# Patient Record
Sex: Female | Born: 1950 | Race: White | Hispanic: No | Marital: Single | Smoking: Never smoker
Health system: Southern US, Community
[De-identification: ages and names within clinical notes are randomized; demographics above are authoritative.]

## PROBLEM LIST (undated history)

## (undated) HISTORY — PX: KNEE SURGERY: SHX244

## (undated) HISTORY — PX: INCONTINENCE SURGERY: SHX676

## (undated) HISTORY — PX: CHOLECYSTECTOMY: SHX55

## (undated) HISTORY — PX: BACK SURGERY: SHX140

## (undated) HISTORY — PX: HAMMER TOE SURGERY: SHX385

## (undated) HISTORY — PX: APPENDECTOMY: SHX54

---

## 1998-04-27 ENCOUNTER — Ambulatory Visit (HOSPITAL_COMMUNITY): Admission: RE | Admit: 1998-04-27 | Discharge: 1998-04-27 | Payer: Self-pay | Admitting: Internal Medicine

## 1998-11-06 ENCOUNTER — Other Ambulatory Visit: Admission: RE | Admit: 1998-11-06 | Discharge: 1998-11-06 | Payer: Self-pay | Admitting: Obstetrics and Gynecology

## 1999-04-30 ENCOUNTER — Encounter: Payer: Self-pay | Admitting: Obstetrics and Gynecology

## 1999-04-30 ENCOUNTER — Ambulatory Visit (HOSPITAL_COMMUNITY): Admission: RE | Admit: 1999-04-30 | Discharge: 1999-04-30 | Payer: Self-pay | Admitting: Obstetrics and Gynecology

## 1999-09-03 ENCOUNTER — Encounter: Admission: RE | Admit: 1999-09-03 | Discharge: 1999-12-02 | Payer: Self-pay | Admitting: Orthopedic Surgery

## 1999-12-16 ENCOUNTER — Encounter: Admission: RE | Admit: 1999-12-16 | Discharge: 2000-03-15 | Payer: Self-pay | Admitting: Orthopedic Surgery

## 2000-01-20 ENCOUNTER — Encounter: Payer: Self-pay | Admitting: Neurosurgery

## 2000-01-21 ENCOUNTER — Observation Stay (HOSPITAL_COMMUNITY): Admission: RE | Admit: 2000-01-21 | Discharge: 2000-01-22 | Payer: Self-pay | Admitting: Neurosurgery

## 2000-01-21 ENCOUNTER — Encounter: Payer: Self-pay | Admitting: Neurosurgery

## 2000-03-25 ENCOUNTER — Emergency Department (HOSPITAL_COMMUNITY): Admission: EM | Admit: 2000-03-25 | Discharge: 2000-03-25 | Payer: Self-pay

## 2000-03-25 ENCOUNTER — Encounter: Payer: Self-pay | Admitting: Emergency Medicine

## 2000-03-30 ENCOUNTER — Ambulatory Visit (HOSPITAL_COMMUNITY): Admission: RE | Admit: 2000-03-30 | Discharge: 2000-03-30 | Payer: Self-pay | Admitting: Neurosurgery

## 2000-05-05 ENCOUNTER — Inpatient Hospital Stay (HOSPITAL_COMMUNITY): Admission: AD | Admit: 2000-05-05 | Discharge: 2000-05-11 | Payer: Self-pay | Admitting: Neurosurgery

## 2000-05-11 ENCOUNTER — Encounter: Payer: Self-pay | Admitting: Neurosurgery

## 2000-06-13 ENCOUNTER — Encounter: Payer: Self-pay | Admitting: Neurosurgery

## 2000-06-13 ENCOUNTER — Ambulatory Visit (HOSPITAL_COMMUNITY): Admission: RE | Admit: 2000-06-13 | Discharge: 2000-06-13 | Payer: Self-pay | Admitting: Neurosurgery

## 2000-07-03 ENCOUNTER — Inpatient Hospital Stay (HOSPITAL_COMMUNITY): Admission: RE | Admit: 2000-07-03 | Discharge: 2000-07-04 | Payer: Self-pay | Admitting: Neurosurgery

## 2000-07-03 ENCOUNTER — Encounter: Payer: Self-pay | Admitting: Neurosurgery

## 2000-07-16 ENCOUNTER — Encounter: Admission: RE | Admit: 2000-07-16 | Discharge: 2000-07-16 | Payer: Self-pay | Admitting: Infectious Diseases

## 2000-08-01 ENCOUNTER — Ambulatory Visit (HOSPITAL_COMMUNITY): Admission: RE | Admit: 2000-08-01 | Discharge: 2000-08-01 | Payer: Self-pay | Admitting: Neurosurgery

## 2000-08-01 ENCOUNTER — Encounter: Payer: Self-pay | Admitting: Neurosurgery

## 2000-08-06 ENCOUNTER — Encounter: Admission: RE | Admit: 2000-08-06 | Discharge: 2000-08-06 | Payer: Self-pay | Admitting: Infectious Diseases

## 2000-10-02 ENCOUNTER — Ambulatory Visit (HOSPITAL_COMMUNITY): Admission: RE | Admit: 2000-10-02 | Discharge: 2000-10-02 | Payer: Self-pay | Admitting: *Deleted

## 2000-10-02 ENCOUNTER — Encounter (INDEPENDENT_AMBULATORY_CARE_PROVIDER_SITE_OTHER): Payer: Self-pay | Admitting: Specialist

## 2000-10-05 ENCOUNTER — Ambulatory Visit (HOSPITAL_COMMUNITY): Admission: RE | Admit: 2000-10-05 | Discharge: 2000-10-05 | Payer: Self-pay | Admitting: *Deleted

## 2000-10-06 ENCOUNTER — Encounter: Admission: RE | Admit: 2000-10-06 | Discharge: 2000-10-22 | Payer: Self-pay | Admitting: Orthopedic Surgery

## 2000-10-14 ENCOUNTER — Encounter: Admission: RE | Admit: 2000-10-14 | Discharge: 2000-10-14 | Payer: Self-pay | Admitting: Infectious Diseases

## 2000-12-21 ENCOUNTER — Other Ambulatory Visit: Admission: RE | Admit: 2000-12-21 | Discharge: 2000-12-21 | Payer: Self-pay | Admitting: Obstetrics and Gynecology

## 2000-12-29 ENCOUNTER — Encounter: Admission: RE | Admit: 2000-12-29 | Discharge: 2001-03-29 | Payer: Self-pay | Admitting: Anesthesiology

## 2001-01-19 ENCOUNTER — Encounter: Admission: RE | Admit: 2001-01-19 | Discharge: 2001-03-15 | Payer: Self-pay | Admitting: Orthopedic Surgery

## 2001-02-02 ENCOUNTER — Ambulatory Visit (HOSPITAL_COMMUNITY): Admission: RE | Admit: 2001-02-02 | Discharge: 2001-02-02 | Payer: Self-pay | Admitting: Neurosurgery

## 2001-02-02 ENCOUNTER — Encounter: Payer: Self-pay | Admitting: Neurosurgery

## 2001-05-11 ENCOUNTER — Encounter: Admission: RE | Admit: 2001-05-11 | Discharge: 2001-05-31 | Payer: Self-pay | Admitting: Internal Medicine

## 2001-09-07 ENCOUNTER — Encounter: Admission: RE | Admit: 2001-09-07 | Discharge: 2001-09-27 | Payer: Self-pay | Admitting: Family Medicine

## 2002-01-09 ENCOUNTER — Encounter: Admission: RE | Admit: 2002-01-09 | Discharge: 2002-02-25 | Payer: Self-pay | Admitting: Internal Medicine

## 2002-01-12 ENCOUNTER — Encounter (HOSPITAL_BASED_OUTPATIENT_CLINIC_OR_DEPARTMENT_OTHER): Payer: Self-pay | Admitting: Internal Medicine

## 2002-05-02 ENCOUNTER — Encounter (HOSPITAL_BASED_OUTPATIENT_CLINIC_OR_DEPARTMENT_OTHER): Admission: RE | Admit: 2002-05-02 | Discharge: 2002-05-06 | Payer: Self-pay | Admitting: Orthopedic Surgery

## 2002-05-11 ENCOUNTER — Other Ambulatory Visit: Admission: RE | Admit: 2002-05-11 | Discharge: 2002-05-11 | Payer: Self-pay | Admitting: Obstetrics and Gynecology

## 2002-06-14 ENCOUNTER — Encounter (HOSPITAL_BASED_OUTPATIENT_CLINIC_OR_DEPARTMENT_OTHER): Admission: RE | Admit: 2002-06-14 | Discharge: 2002-09-12 | Payer: Self-pay | Admitting: Orthopedic Surgery

## 2002-11-28 ENCOUNTER — Encounter (HOSPITAL_BASED_OUTPATIENT_CLINIC_OR_DEPARTMENT_OTHER): Admission: RE | Admit: 2002-11-28 | Discharge: 2003-02-26 | Payer: Self-pay | Admitting: Internal Medicine

## 2003-02-24 ENCOUNTER — Ambulatory Visit (HOSPITAL_COMMUNITY): Admission: RE | Admit: 2003-02-24 | Discharge: 2003-02-24 | Payer: Self-pay | Admitting: *Deleted

## 2003-05-31 ENCOUNTER — Inpatient Hospital Stay (HOSPITAL_COMMUNITY): Admission: RE | Admit: 2003-05-31 | Discharge: 2003-06-02 | Payer: Self-pay | Admitting: Urology

## 2003-05-31 ENCOUNTER — Encounter: Payer: Self-pay | Admitting: Urology

## 2003-06-01 ENCOUNTER — Encounter (INDEPENDENT_AMBULATORY_CARE_PROVIDER_SITE_OTHER): Payer: Self-pay | Admitting: Specialist

## 2004-03-14 ENCOUNTER — Other Ambulatory Visit: Admission: RE | Admit: 2004-03-14 | Discharge: 2004-03-14 | Payer: Self-pay | Admitting: Obstetrics and Gynecology

## 2004-04-05 ENCOUNTER — Encounter (HOSPITAL_BASED_OUTPATIENT_CLINIC_OR_DEPARTMENT_OTHER): Admission: RE | Admit: 2004-04-05 | Discharge: 2004-04-12 | Payer: Self-pay | Admitting: Internal Medicine

## 2004-07-10 ENCOUNTER — Encounter (HOSPITAL_BASED_OUTPATIENT_CLINIC_OR_DEPARTMENT_OTHER): Admission: RE | Admit: 2004-07-10 | Discharge: 2004-08-28 | Payer: Self-pay | Admitting: Internal Medicine

## 2004-09-13 ENCOUNTER — Encounter: Admission: RE | Admit: 2004-09-13 | Discharge: 2004-09-13 | Payer: Self-pay | Admitting: Obstetrics and Gynecology

## 2004-11-24 LAB — HM COLONOSCOPY: HM Colonoscopy: NORMAL

## 2004-11-26 ENCOUNTER — Ambulatory Visit (HOSPITAL_COMMUNITY): Admission: RE | Admit: 2004-11-26 | Discharge: 2004-11-26 | Payer: Self-pay | Admitting: Cardiology

## 2005-09-04 ENCOUNTER — Encounter: Admission: RE | Admit: 2005-09-04 | Discharge: 2005-09-04 | Payer: Self-pay | Admitting: Endocrinology

## 2006-06-03 ENCOUNTER — Encounter: Admission: RE | Admit: 2006-06-03 | Discharge: 2006-06-03 | Payer: Self-pay | Admitting: Obstetrics and Gynecology

## 2008-02-28 ENCOUNTER — Inpatient Hospital Stay (HOSPITAL_COMMUNITY): Admission: EM | Admit: 2008-02-28 | Discharge: 2008-03-01 | Payer: Self-pay | Admitting: Emergency Medicine

## 2008-09-13 ENCOUNTER — Encounter: Admission: RE | Admit: 2008-09-13 | Discharge: 2008-09-13 | Payer: Self-pay | Admitting: Endocrinology

## 2009-05-17 ENCOUNTER — Encounter: Admission: RE | Admit: 2009-05-17 | Discharge: 2009-05-17 | Payer: Self-pay | Admitting: Anesthesiology

## 2010-09-28 LAB — HM DIABETES FOOT EXAM

## 2010-10-08 ENCOUNTER — Encounter: Payer: Self-pay | Admitting: Internal Medicine

## 2010-10-08 ENCOUNTER — Ambulatory Visit: Payer: Self-pay | Admitting: Family Medicine

## 2010-10-08 DIAGNOSIS — J45909 Unspecified asthma, uncomplicated: Secondary | ICD-10-CM | POA: Insufficient documentation

## 2010-10-08 DIAGNOSIS — C189 Malignant neoplasm of colon, unspecified: Secondary | ICD-10-CM | POA: Insufficient documentation

## 2010-10-08 DIAGNOSIS — F329 Major depressive disorder, single episode, unspecified: Secondary | ICD-10-CM

## 2010-10-08 DIAGNOSIS — L97409 Non-pressure chronic ulcer of unspecified heel and midfoot with unspecified severity: Secondary | ICD-10-CM | POA: Insufficient documentation

## 2010-10-08 DIAGNOSIS — N3941 Urge incontinence: Secondary | ICD-10-CM | POA: Insufficient documentation

## 2010-10-08 DIAGNOSIS — R300 Dysuria: Secondary | ICD-10-CM | POA: Insufficient documentation

## 2010-10-08 DIAGNOSIS — I10 Essential (primary) hypertension: Secondary | ICD-10-CM

## 2010-10-08 DIAGNOSIS — E1149 Type 2 diabetes mellitus with other diabetic neurological complication: Secondary | ICD-10-CM | POA: Insufficient documentation

## 2010-10-08 DIAGNOSIS — D509 Iron deficiency anemia, unspecified: Secondary | ICD-10-CM

## 2010-10-08 DIAGNOSIS — G4700 Insomnia, unspecified: Secondary | ICD-10-CM

## 2010-10-08 DIAGNOSIS — E1142 Type 2 diabetes mellitus with diabetic polyneuropathy: Secondary | ICD-10-CM

## 2010-10-08 DIAGNOSIS — E7800 Pure hypercholesterolemia, unspecified: Secondary | ICD-10-CM

## 2010-10-08 DIAGNOSIS — M545 Low back pain: Secondary | ICD-10-CM

## 2010-10-08 DIAGNOSIS — A4902 Methicillin resistant Staphylococcus aureus infection, unspecified site: Secondary | ICD-10-CM | POA: Insufficient documentation

## 2010-10-08 DIAGNOSIS — L97509 Non-pressure chronic ulcer of other part of unspecified foot with unspecified severity: Secondary | ICD-10-CM

## 2010-10-08 DIAGNOSIS — K219 Gastro-esophageal reflux disease without esophagitis: Secondary | ICD-10-CM

## 2010-10-08 DIAGNOSIS — J309 Allergic rhinitis, unspecified: Secondary | ICD-10-CM | POA: Insufficient documentation

## 2010-10-08 LAB — CONVERTED CEMR LAB
Bilirubin Urine: NEGATIVE
Glucose, Urine, Semiquant: NEGATIVE
Protein, U semiquant: 30
Specific Gravity, Urine: 1.015
pH: 5

## 2010-10-15 ENCOUNTER — Ambulatory Visit: Payer: Self-pay | Admitting: Internal Medicine

## 2010-10-15 ENCOUNTER — Telehealth: Payer: Self-pay | Admitting: Family Medicine

## 2010-10-16 ENCOUNTER — Encounter: Payer: Self-pay | Admitting: Family Medicine

## 2010-10-22 ENCOUNTER — Telehealth: Payer: Self-pay | Admitting: Family Medicine

## 2010-10-23 ENCOUNTER — Ambulatory Visit: Payer: Self-pay

## 2010-10-30 ENCOUNTER — Ambulatory Visit: Payer: Self-pay

## 2010-11-04 ENCOUNTER — Ambulatory Visit: Payer: Self-pay | Admitting: Internal Medicine

## 2010-11-14 ENCOUNTER — Ambulatory Visit: Payer: Self-pay

## 2010-11-14 ENCOUNTER — Other Ambulatory Visit: Payer: Self-pay

## 2010-11-20 ENCOUNTER — Ambulatory Visit: Payer: Self-pay

## 2010-11-22 ENCOUNTER — Ambulatory Visit: Payer: Self-pay

## 2010-11-22 ENCOUNTER — Ambulatory Visit: Payer: Self-pay | Admitting: Family Medicine

## 2010-11-29 ENCOUNTER — Encounter (INDEPENDENT_AMBULATORY_CARE_PROVIDER_SITE_OTHER): Payer: Self-pay | Admitting: *Deleted

## 2010-11-29 ENCOUNTER — Ambulatory Visit: Admit: 2010-11-29 | Payer: Self-pay | Admitting: Internal Medicine

## 2010-11-29 ENCOUNTER — Emergency Department: Payer: Self-pay | Admitting: Emergency Medicine

## 2010-12-15 ENCOUNTER — Encounter: Payer: Self-pay | Admitting: Obstetrics and Gynecology

## 2010-12-15 ENCOUNTER — Encounter: Payer: Self-pay | Admitting: Endocrinology

## 2010-12-24 NOTE — Assessment & Plan Note (Signed)
Summary: NEW MEDICARE TO ESTABH/DLO   Vital Signs:  Patient profile:   60 year old female Height:      65 inches Weight:      244.0 pounds BMI:     40.75 Temp:     98.6 degrees F oral Pulse rate:   72 / minute Pulse rhythm:   regular BP sitting:   120 / 72  (left arm) Cuff size:   regular  Vitals Entered By: Benny Lennert CMA Duncan Dull) (October 08, 2010 10:10 AM)  History of Present Illness: Chief complaint New Medicare Patient   Here to establish...has been seen by Dr. Juleen China in [place] for primary MD. Too far to drive.  Has been off all oral med in last month..since did not go back to previous MD.  Dx with colon adenocarcinoma in 1995...has not seen ONC in 7 years.  S/P radiation, chem and colon resection.   Chronic back pain, B carpal tunnel, peripheral neuropathy... Seen by Dr. Thyra Breed at pain Center... On GApapentin and OPANA..Well  controlled on these meds  Depression, wel controlled on sertraline daily.   Diabetes..Dx 15 years ago: On Actos, JAnuvia, LAntus 80 Units daily, Humalog SSI...doesn't have to use a lot.  LAst A1C 9.something..but thinks this was last year. FBS..100-109, 2 hours postprandial...not checking then, no low LAst saw primary 2-3 months ago..she never went in for labs.  Current lesion in left toe, ulcers on right leg..off and on not healing..present x 10 months..saw wound care center but could not keep up with appts/ cost. no pus..  High cholesterol: on crestor..? control  GERD, well controlled when on aciphex..not well controlled when on pantoprazole x several months  HTN, well controlled.[place] Footman is only  med she has been taking.    Problems Prior to Update: 1)  Dysuria  (ICD-788.1) 2)  Diabetic Foot Ulcer, Toe  (ICD-707.15) 3)  Diabetic Foot Ulcer, Right  (ICD-707.14) 4)  Hx of Methicillin Resistant Staphylococcus Aureus Infection  (ICD-041.12) 5)  Obesity, Morbid  (ICD-278.01) 6)  Insomnia  (ICD-780.52) 7)  Incontinence, Urge   (ICD-788.31) 8)  Nephrolithiasis, Hx of  (ICD-V13.01) 9)  Hypertension, Benign Essential  (ICD-401.1) 10)  Gerd  (ICD-530.81) 11)  Depression, Major  (ICD-296.20) 12)  Allergic Rhinitis  (ICD-477.9) 13)  Asthma, Persistent, Moderate  (ICD-493.90) 14)  Diabetic Peripheral Neuropathy  (ICD-250.60) 15)  Back Pain, Lumbar, Chronic  (ICD-724.2) 16)  Adenocarcinoma, Colon  (ICD-153.9) 17)  Anemia, Iron Deficiency  (ICD-280.9)  Current Medications (verified): 1)  Zoloft 100 Mg Tabs (Sertraline Hcl) .... Two Tabs Daily 2)  Nasonex 50 Mcg/act Susp (Mometasone Furoate) .... 2 Sprays in Each Nostril Daily 3)  Singulair 10 Mg Tabs (Montelukast Sodium) .Marland Kitchen.. 1 Tablet Daily 4)  Klor-Con 10 10 Meq Cr-Tabs (Potassium Chloride) .Marland Kitchen.. 1-2 Tabs Daily 5)  Benicar Hct 40-25 Mg Tabs (Olmesartan Medoxomil-Hctz) .Marland Kitchen.. 1 Tablet Daily 6)  Bystolic 5 Mg Tabs (Nebivolol Hcl) .... Once Daily 7)  Enablex 15 Mg Xr24h-Tab (Darifenacin Hydrobromide) .... Once Daily 8)  Lunesta 3 Mg Tabs (Eszopiclone) .... Once Daily At Bedtime 9)  Torsemide 20 Mg Tabs (Torsemide) .... Take One Tablet Daily 10)  Crestor 20 Mg Tabs (Rosuvastatin Calcium) .... One Tablet Daily 11)  Neurontin 800 Mg Tabs (Gabapentin) .... One Tablet Every 8 Hours 12)  Opana Er 20 Mg Xr12h-Tab (Oxymorphone Hcl) .... Take One Tablet Every 12 Hours 13)  Aciphex 20 Mg Tbec (Rabeprazole Sodium) .... One Tablet Dialy 14)  Actos 45 Mg Tabs (Pioglitazone Hcl) .Marland KitchenMarland KitchenMarland Kitchen  One Tablet Daily 15)  Januvia 100 Mg Tabs (Sitagliptin Phosphate) .... Take One Tablet Daily 16)  Promethazine Hcl 25 Mg/ml Soln (Promethazine Hcl) .... As Needed 17)  Lantus Solostar 100 Unit/ml Soln (Insulin Glargine) .... 80 Units Two Times A Day 18)  Humalog 100 Unit/ml Soln (Insulin Lispro (Human)) .... Use A Sliding Scale 19)  Proair Hfa 108 (90 Base) Mcg/act Aers (Albuterol Sulfate) .... 2 Puffs By Mouth Every 4 Hours Prn 20)  Symbicort 80-4.5 Mcg/act Aero (Budesonide-Formoterol Fumarate) .... 2  Puffs Two Times A Day 21)  Onetouch Test  Strp (Glucose Blood) .... Twice Daily 22)  Gnp Ultra Com Insulin Syringe 31g X 5/16" 1 Ml Misc (Insulin Syringe-Needle U-100) .Marland Kitchen.. 100 Count 23)  Sure Comfort Pen Needles 31g X 8 Mm Misc (Insulin Pen Needle) .Marland Kitchen.. 100 Count 24)  Aspir-Low 81 Mg Tbec (Aspirin) .... One Tablet Dialy 25)  Ferrous Sulfate 325 (65 Fe) Mg Tabs (Ferrous Sulfate) .... One Tablet Daily 26)  Calcium 500 Mg Tabs (Calcium) .... One Tablet Daily  Allergies (verified): 1)  ! Sulfa 2)  ! Codeine 3)  ! Doxycycline  Past History:  Past medical, surgical, family and social histories (including risk factors) reviewed, and no changes noted (except as noted below).  Past Surgical History: partial colectomy for CA...1995  appendectomy at same time 1995 Bladder netting for urge incontinence  2006 Hammer toe repair: 1994 lumbar back surgery for herniated disc 2000..complicated with MRSA wound nfection right knee surgery...2009   Family History: Reviewed history and no changes required. father:CVA  mother: died age 48 brother: CAD age 75s..due to infeciton in heart per pt  Social History: Reviewed history and no changes required. Disabled due to back issues Walks with cane.  Review of Systems       Rash, ulcer on right leg, itching.Marland Kitchen weeping.Marland KitchenMarland Kitchenpresent x 6 months. General:  Denies fatigue and fever. CV:  Denies chest pain or discomfort. Resp:  Denies shortness of breath. GI:  Denies abdominal pain, bloody stools, constipation, and diarrhea. GU:  Denies hematuria and nocturia; pressure in bladder when urinating.  Physical Exam  General:  Obese unkempt appearing female inNNAD  Ears:  External ear exam shows no significant lesions or deformities.  Otoscopic examination reveals clear canals, tympanic membranes are intact bilaterally without bulging, retraction, inflammation or discharge. Hearing is grossly normal bilaterally. Nose:  External nasal examination shows no  deformity or inflammation. Nasal mucosa are pink and moist without lesions or exudates. Mouth:  MMM, poor dentition.  Neck:  no carotid bruit or thyromegaly no cervical or supraclavicular lymphadenopathy  Lungs:  Normal respiratory effort, chest expands symmetrically. Lungs are clear to auscultation, no crackles or wheezes. Heart:  Normal rate and regular rhythm. S1 and S2 normal without gallop, murmur, click, rub or other extra sounds. Abdomen:  Bowel sounds positive,abdomen soft and non-tender without masses, organomegaly or hernias noted. Msk:  No deformity or scoliosis noted of thoracic or lumbar spine.   Pulses:  R and L posterior tibial pulses are full and equal bilaterally  Extremities:  left toe with bandage..removed..granulation tissue in large wound on great toe, appears adequate perfusion. Right calf..6 ich by 4 inch ulceration mid calf..clear serosanguinous discharge, layer of nectrotic, moist tissue, few areas of granulation tissue.  Diabetes Management Exam:    Foot Exam (with socks and/or shoes not present):       Sensory-Pinprick/Light touch:          Left medial foot (L-4): diminished  Left dorsal foot (L-5): diminished          Left lateral foot (S-1): diminished          Right medial foot (L-4): diminished          Right dorsal foot (L-5): diminished          Right lateral foot (S-1): diminished       Sensory-Monofilament:          Left foot: diminished          Right foot: diminished       Inspection:          Left foot: abnormal          Right foot: abnormal       Nails:          Left foot: thickened          Right foot: thickened   Impression & Recommendations:  Problem # 1:  DIABETIC FOOT ULCER, RIGHT (ICD-707.14) On right leg Chronic...no clear ongoing infection. Likely needs UNNA boot as wel as debridement to heal properly. Refer to wound center ASAP.  can follow here for unna boot changes if needed once eval and treament plan set.   Orders: Gastroenterology Referral (GI)  Problem # 2:  DIABETIC FOOT ULCER, TOE (ICD-707.15) Assessment: Comment Only  Orders: Wound Care Center Referral (Wound Care)  Problem # 3:  DYSURIA (ICD-788.1) No clear infection, but contaminated...send for clture.  Push fluids.  Her updated medication list for this problem includes:    Enablex 15 Mg Xr24h-tab (Darifenacin hydrobromide) ..... Once daily  Orders: UA Dipstick W/ Micro (manual) (36644) T-Culture, Urine (03474-25956) Specimen Handling (99000)  Problem # 4:  HYPERTENSION, BENIGN ESSENTIAL (ICD-401.1) Assessment: Unchanged Restart medicaiton. Follow BPs at home.  Her updated medication list for this problem includes:    Benicar Hct 40-25 Mg Tabs (Olmesartan medoxomil-hctz) .Marland Kitchen... 1 tablet daily    Bystolic 5 Mg Tabs (Nebivolol hcl) ..... Once daily    Torsemide 20 Mg Tabs (Torsemide) .Marland Kitchen... Take one tablet daily  BP today: 120/72  Problem # 5:  DEPRESSION, MAJOR (ICD-296.20) Stable on current dose of sertraline.   Problem # 6:  BACK PAIN, LUMBAR, CHRONIC (ICD-724.2) Seen by Dr. Thyra Breed at pain Center... On GApapentin and OPANA Her updated medication list for this problem includes:    Opana Er 20 Mg Xr12h-tab (Oxymorphone hcl) .Marland Kitchen... Take one tablet every 12 hours    Aspir-low 81 Mg Tbec (Aspirin) ..... One tablet dialy  Problem # 7:  DIABETES MELLITUS, TYPE II (ICD-250.00) Restart meds..check A1C in 3 months..per pt previously well contorlled on these meds.  Her updated medication list for this problem includes:    Benicar Hct 40-25 Mg Tabs (Olmesartan medoxomil-hctz) .Marland Kitchen... 1 tablet daily    Actos 45 Mg Tabs (Pioglitazone hcl) ..... One tablet daily    Januvia 100 Mg Tabs (Sitagliptin phosphate) .Marland Kitchen... Take one tablet daily    Lantus Solostar 100 Unit/ml Soln (Insulin glargine) .Marland KitchenMarland KitchenMarland KitchenMarland Kitchen 80 units two times a day    Humalog 100 Unit/ml Soln (Insulin lispro (human)) ..... Use a sliding scale    Aspir-low 81 Mg Tbec  (Aspirin) ..... One tablet dialy  Problem # 8:  HYPERCHOLESTEROLEMIA (ICD-272.0) Restart crestor..check lipids in 3 months.  Her updated medication list for this problem includes:    Crestor 20 Mg Tabs (Rosuvastatin calcium) ..... One tablet daily  Problem # 9:  ADENOCARCINOMA, COLON (ICD-153.9) Overdue for recheck colonoscopy. Refer to GI ASAP.  Orders: Gastroenterology Referral (GI)  Complete Medication List: 1)  Zoloft 100 Mg Tabs (Sertraline hcl) .... Two tabs daily 2)  Nasonex 50 Mcg/act Susp (Mometasone furoate) .... 2 sprays in each nostril daily 3)  Singulair 10 Mg Tabs (Montelukast sodium) .Marland Kitchen.. 1 tablet daily 4)  Klor-con 10 10 Meq Cr-tabs (Potassium chloride) .Marland Kitchen.. 1-2 tabs daily 5)  Benicar Hct 40-25 Mg Tabs (Olmesartan medoxomil-hctz) .Marland Kitchen.. 1 tablet daily 6)  Bystolic 5 Mg Tabs (Nebivolol hcl) .... Once daily 7)  Enablex 15 Mg Xr24h-tab (Darifenacin hydrobromide) .... Once daily 8)  Lunesta 3 Mg Tabs (Eszopiclone) .... Once daily at bedtime 9)  Torsemide 20 Mg Tabs (Torsemide) .... Take one tablet daily 10)  Crestor 20 Mg Tabs (Rosuvastatin calcium) .... One tablet daily 11)  Neurontin 800 Mg Tabs (Gabapentin) .... One tablet every 8 hours 12)  Opana Er 20 Mg Xr12h-tab (Oxymorphone hcl) .... Take one tablet every 12 hours 13)  Aciphex 20 Mg Tbec (Rabeprazole sodium) .... One tablet dialy 14)  Actos 45 Mg Tabs (Pioglitazone hcl) .... One tablet daily 15)  Januvia 100 Mg Tabs (Sitagliptin phosphate) .... Take one tablet daily 16)  Promethazine Hcl 25 Mg/ml Soln (Promethazine hcl) .... As needed 17)  Lantus Solostar 100 Unit/ml Soln (Insulin glargine) .... 80 units two times a day 18)  Humalog 100 Unit/ml Soln (Insulin lispro (human)) .... Use a sliding scale 19)  Proair Hfa 108 (90 Base) Mcg/act Aers (Albuterol sulfate) .... 2 puffs by mouth every 4 hours prn 20)  Symbicort 80-4.5 Mcg/act Aero (Budesonide-formoterol fumarate) .... 2 puffs two times a day 21)  Onetouch Test  Strp (Glucose blood) .... Twice daily 22)  Gnp Ultra Com Insulin Syringe 31g X 5/16" 1 Ml Misc (Insulin syringe-needle u-100) .Marland Kitchen.. 100 count 23)  Sure Comfort Pen Needles 31g X 8 Mm Misc (Insulin pen needle) .Marland Kitchen.. 100 count 24)  Aspir-low 81 Mg Tbec (Aspirin) .... One tablet dialy 25)  Ferrous Sulfate 325 (65 Fe) Mg Tabs (Ferrous sulfate) .... One tablet daily 26)  Calcium 500 Mg Tabs (Calcium) .... One tablet daily  Other Orders: Flu Vaccine 87yrs + MEDICARE PATIENTS (Z6109) Administration Flu vaccine - MCR (U0454)  Patient Instructions: 1)  Referral Appointment Information 2)  Day/Date: 3)  Time: 4)  Place/MD: 5)  Address: 6)  Phone/Fax: 7)  Patient given appointment information. Information/Orders faxed/mailed.  8)  ****Restart all meds*** 9)  Check fasting blood sugar daily and 2 hours after meals...write it down and bring record on measurements. 10)  Make appt for CPX in next month.  11)   In 3 months... DM follow up 12)  Fasting lipids, A1C, CMET Dx 250.00 prior to appt. Prescriptions: HUMALOG 100 UNIT/ML SOLN (INSULIN LISPRO (HUMAN)) USE A SLIDING SCALE  #0 x 0   Entered and Authorized by:   Kerby Nora MD   Signed by:   Kerby Nora MD on 10/08/2010   Method used:   Print then Give to Patient   RxID:   [id number] LANTUS SOLOSTAR 100 UNIT/ML SOLN (INSULIN GLARGINE) 80 UNITS two times a day  #6 boxes x 0   Entered and Authorized by:   Kerby Nora MD   Signed by:   Kerby Nora MD on 10/08/2010   Method used:   Print then Give to Patient   RxID:   [id number] JANUVIA 100 MG TABS (SITAGLIPTIN PHOSPHATE) TAKE ONE TABLET DAILY  #90 x 0   Entered and Authorized by:   Kerby Nora MD   Signed  by:   Kerby Nora MD on 10/08/2010   Method used:   Print then Give to Patient   RxID:   [id number] ACTOS 45 MG TABS (PIOGLITAZONE HCL) ONE TABLET DAILY  #90 x 0   Entered and Authorized by:   Kerby Nora MD   Signed by:   Kerby Nora MD on 10/08/2010   Method used:   Print  then Give to Patient   RxID:   [id number] ACIPHEX 20 MG TBEC (RABEPRAZOLE SODIUM) ONE TABLET DIALY  #90 x 0   Entered and Authorized by:   Kerby Nora MD   Signed by:   Kerby Nora MD on 10/08/2010   Method used:   Print then Give to Patient   RxID:   [id number] CRESTOR 20 MG TABS (ROSUVASTATIN CALCIUM) ONE TABLET DAILY  #90 x 0   Entered and Authorized by:   Kerby Nora MD   Signed by:   Kerby Nora MD on 10/08/2010   Method used:   Print then Give to Patient   RxID:   [id number] TORSEMIDE 20 MG TABS (TORSEMIDE) TAKE ONE TABLET DAILY  #90 x 0   Entered and Authorized by:   Kerby Nora MD   Signed by:   Kerby Nora MD on 10/08/2010   Method used:   Print then Give to Patient   RxID:   [id number] LUNESTA 3 MG TABS (ESZOPICLONE) once daily AT BEDTIME  #90 x 0   Entered and Authorized by:   Kerby Nora MD   Signed by:   Kerby Nora MD on 10/08/2010   Method used:   Print then Give to Patient   RxID:   [id number] ENABLEX 15 MG XR24H-TAB (DARIFENACIN HYDROBROMIDE) once daily  #90 x 0   Entered and Authorized by:   Kerby Nora MD   Signed by:   Kerby Nora MD on 10/08/2010   Method used:   Print then Give to Patient   RxID:   [id number] BYSTOLIC 5 MG TABS (NEBIVOLOL HCL) once daily  #90 x 0   Entered and Authorized by:   Kerby Nora MD   Signed by:   Kerby Nora MD on 10/08/2010   Method used:   Print then Give to Patient   RxID:   [id number] BENICAR HCT 40-25 MG TABS (OLMESARTAN MEDOXOMIL-HCTZ) 1 TABLET DAILY  #90 x 0   Entered and Authorized by:   Kerby Nora MD   Signed by:   Kerby Nora MD on 10/08/2010   Method used:   Print then Give to Patient   RxID:   [id number] KLOR-CON 10 10 MEQ CR-TABS (POTASSIUM CHLORIDE) 1-2 TABS DAILY  #90 x 0   Entered and Authorized by:   Kerby Nora MD   Signed by:   Kerby Nora MD on 10/08/2010   Method used:   Print then Give to Patient   RxID:   [id number] SINGULAIR 10 MG TABS (MONTELUKAST  SODIUM) 1 tablet daily  #90 x 0   Entered and Authorized by:   Kerby Nora MD   Signed by:   Kerby Nora MD on 10/08/2010   Method used:   Print then Give to Patient   RxID:   [id number] NASONEX 50 MCG/ACT SUSP (MOMETASONE FUROATE) 2 sprays in each nostril daily  #3 x 0   Entered and Authorized by:   Kerby Nora MD   Signed by:   Kerby Nora MD on 10/08/2010   Method used:   Print then Give to Patient   RxID:  [id number] ZOLOFT 100 MG TABS (SERTRALINE HCL) two tabs daily  #180 x 0   Entered and Authorized by:   Kerby Nora MD   Signed by:   Kerby Nora MD on 10/08/2010   Method used:   Print then Give to Patient   RxID:   [id number]    Orders Added: 1)  Flu Vaccine 8yrs + MEDICARE PATIENTS [Q2039] 2)  Administration Flu vaccine - MCR [G0008] 3)  UA Dipstick W/ Micro (manual) [81000] 4)  T-Culture, Urine [[id number]] 5)  Gastroenterology Referral [GI] 6)  Wound Care Center Referral [Wound Care] 7)  Specimen Handling [99000] 8)  New Patient Level III [63875]    Current Allergies (reviewed today): ! SULFA ! CODEINE ! DOXYCYCLINE                   Flu Vaccine Consent Questions     Do you have a history of severe allergic reactions to this vaccine? no    Any prior history of allergic reactions to egg and/or gelatin? no    Do you have a sensitivity to the preservative Thimersol? no    Do you have a past history of Guillan-Barre Syndrome? no    Do you currently have an acute febrile illness? no    Have you ever had a severe reaction to latex? no    Vaccine information given and explained to patient? yes    Are you currently pregnant? no    Lot Number:[id number]   Exp Date:05/24/2011   Site Given  Left Deltoid [id number]  Prevention & Chronic Care Immunizations   Influenza vaccine: given  (10/08/2010)   Influenza vaccine due: 10/09/2011    Tetanus booster: Not documented    Pneumococcal vaccine: Not documented  Colorectal  Screening   Hemoccult: Not documented   Hemoccult due: Not Indicated    Colonoscopy: normal  (11/24/2004)   Colonoscopy due: 11/24/2009  Other Screening   Pap smear: Not documented    Mammogram: Not documented   Smoking status: Not documented  Diabetes Mellitus   HgbA1C: Not documented    Eye exam: Not documented    Foot exam: yes  (10/08/2010)   High risk foot: Not documented   Foot care education: Not documented    Urine microalbumin/creatinine ratio: Not documented  Lipids   Total Cholesterol: Not documented   LDL: Not documented   LDL Direct: Not documented   HDL: Not documented   Triglycerides: Not documented    SGOT (AST): Not documented   SGPT (ALT): Not documented   Alkaline phosphatase: Not documented   Total bilirubin: Not documented  Hypertension   Last Blood Pressure: 120 / 72  (10/08/2010)   Serum creatinine: Not documented   Serum potassium Not documented  Self-Management Support :    Diabetes self-management support: Not documented    Hypertension self-management support: Not documented    Lipid self-management support: Not documented    Flu Vaccine Result Date:  10/08/2010 Flu Vaccine Result:  given Flu Vaccine Next Due:  1 yr Flex Sig Next Due:  Not Indicated Colonoscopy Result Date:  11/24/2004 Colonoscopy Result:  normal Colonoscopy Next Due:  5 yr Hemoccult Next Due:  Not Indicated  Laboratory Results   Urine Tests  Date/Time Received: October 08, 2010 11:41 AM  Date/Time Reported: October 08, 2010 11:41 AM   Routine Urinalysis   Color: yellow Appearance: Clear Glucose: negative   (Normal Range: Negative) Bilirubin: negative   (Normal Range: Negative) Ketone: negative   (Normal Range:  Negative) Spec. Gravity: 1.015   (Normal Range: 1.003-1.035) Blood: moderate   (Normal Range: Negative) pH: 5.0   (Normal Range: 5.0-8.0) Protein: 30   (Normal Range: Negative) Urobilinogen: 0.2   (Normal Range: 0-1) Nitrite: negative    (Normal Range: Negative) Leukocyte Esterace: moderate   (Normal Range: Negative)  Urine Microscopic WBC/HPF: 0-2 RBC/HPF: 0-2 Epithelial/HPF: many    Comments: contaminated

## 2010-12-24 NOTE — Progress Notes (Signed)
Summary: prior auth needed for aciphex  Phone Note From Pharmacy   Caller: [facility] [address]/ [facility] Summary of Call: Prior Yvette Chavez is needed for aciphex, form is on your desk. Initial call taken by: Lowella Petties CMA, AAMA,  October 15, 2010 4:11 PM  Follow-up for Phone Call        Will complete on my return. Follow-up by: Kerby Nora MD,  October 16, 2010 12:34 AM     Appended Document: prior auth needed for aciphex Prior auth given for aciphex, advised pharmacy, approval letter placed on doctor's desk for signature and scanning.

## 2010-12-24 NOTE — Medication Information (Signed)
Summary: Approved/[facility]  Approved/[facility]   Imported By: Lester [place] 10/26/2010 10:36:07  _____________________________________________________________________  External Attachment:    Type:   Image     Comment:   External Document

## 2010-12-24 NOTE — Letter (Signed)
Summary: Dr Marylen Ponto records  Dr Marylen Ponto records   Imported By: Lester [place] 10/26/2010 11:09:23  _____________________________________________________________________  External Attachment:    Type:   Image     Comment:   External Document

## 2010-12-24 NOTE — Letter (Signed)
Summary: Pre Visit Letter Revised  [facility]  [address] [place], [place] [address]   Phone: [phone]  Fax: [phone]        10/08/2010 MRN: [id number]  [facility] [address] [place], [place]  [address]             Procedure Date:  12-13-10  9am           Dr Juanda Chance   Welcome to the Gastroenterology Division at [facility].    You are scheduled to see a nurse for your pre-procedure visit on 11-29-2010 at 2:30pm on the 3rd floor at [facility], [address]  We ask that you try to arrive at our office 15 minutes prior to your appointment time to allow for check-in.  Please take a minute to review the attached form.  If you answer "Yes" to one or more of the questions on the first page, we ask that you call the person listed at your earliest opportunity.  If you answer "No" to all of the questions, please complete the rest of the form and bring it to your appointment.    Your nurse visit will consist of discussing your medical and surgical history, your immediate family medical history, and your medications.   If you are unable to list all of your medications on the form, please bring the medication bottles to your appointment and we will list them.  We will need to be aware of both prescribed and over the counter drugs.  We will need to know exact dosage information as well.    Please be prepared to read and sign documents such as consent forms, a financial agreement, and acknowledgement forms.  If necessary, and with your consent, a friend or relative is welcome to sit-in on the nurse visit with you.  Please bring your insurance card so that we may make a copy of it.  If your insurance requires a referral to see a specialist, please bring your referral form from your primary care physician.  No co-pay is required for this nurse visit.     If you cannot keep your appointment, please call [phone] to cancel or reschedule prior to your  appointment date.  This allows [place] the opportunity to schedule an appointment for another patient in need of care.    Thank you for choosing [facility] for your medical needs.  We appreciate the opportunity to care for you.  Please visit [place] at our website  to learn more about our practice.  Sincerely, The Gastroenterology Division

## 2010-12-24 NOTE — Progress Notes (Signed)
  Phone Note From Other Clinic   Details for Reason: Old Med Records: [place] Summary of Call: No tetanus or pneumovax listed.      last DXA 2007 normal let pt know med records reviewed..no vaccines such as tetanus listed... unless she is aware of recieving these elsewhere we will up date her at her next OV.  Kerby Nora MD  October 22, 2010 7:58 AM    Number is not a working number so i will advise patient at next appt.Consuello Masse CMA

## 2010-12-26 NOTE — Miscellaneous (Signed)
Summary: Colonoscopy  We called all the numbers listed and all of the patients contacts. There is not a working number all are disconnected. I have mailed the patient a letter to advise her since she no showed her previsit I have cxed her colonoscopy. She also was a previous Dr. Jarold Motto patient and she will need to set her procedure up with him or ask for a change in MD.

## 2010-12-26 NOTE — Letter (Signed)
Summary: Pre Visit No Show Letter  [facility]  [address] [place], [place] [address]   Phone: [phone]  Fax: [phone]        November 29, 2010 MRN: [id number]    [facility] [address] [place], [place]  [address]    Dear Yvette Chavez,   We have been unable to reach you by phone concerning the pre-procedure visit that you missed on 11/29/2010. For this reason,your procedure scheduled on1/20/2012 has been cancelled. Our scheduling staff will gladly assist you with rescheduling your appointments at a more convenient time. Please call our office at [phone] between the hours of 8:00am and 5:00pm, press option #2 to reach an appointment scheduler. Please consider updating your contact numbers at this time so that we can reach you by phone in the future with schedule changes or results.    Thank you,    Harlow Mares CMA (AAMA) [facility]

## 2011-01-09 ENCOUNTER — Other Ambulatory Visit: Payer: Self-pay

## 2011-01-23 ENCOUNTER — Encounter: Payer: Self-pay | Admitting: Cardiothoracic Surgery

## 2011-02-14 ENCOUNTER — Other Ambulatory Visit: Payer: Self-pay | Admitting: Family Medicine

## 2011-02-14 NOTE — Telephone Encounter (Signed)
Denied.. Overdue for follow up... Pt instructions from 09/2010... Recommended 3 months DM follow up and labs prior. If this is scheduled... It is okay to refill #30, 0 RF.

## 2011-02-17 NOTE — Telephone Encounter (Signed)
Faxed back a refill request saying patient needs appt and numbers are disconnected to see if they can contact patient

## 2011-02-17 NOTE — Telephone Encounter (Signed)
Patients phones are disconnected and can not reach. Contacts numbers are disconnected also

## 2011-02-20 ENCOUNTER — Encounter: Payer: Self-pay | Admitting: Nurse Practitioner

## 2011-02-23 ENCOUNTER — Encounter: Payer: Self-pay | Admitting: Cardiothoracic Surgery

## 2011-02-23 ENCOUNTER — Encounter: Payer: Self-pay | Admitting: Nurse Practitioner

## 2011-03-25 ENCOUNTER — Encounter: Payer: Self-pay | Admitting: Nurse Practitioner

## 2011-03-25 ENCOUNTER — Encounter: Payer: Self-pay | Admitting: Cardiothoracic Surgery

## 2011-04-03 ENCOUNTER — Other Ambulatory Visit: Payer: Self-pay | Admitting: Family Medicine

## 2011-04-07 ENCOUNTER — Other Ambulatory Visit: Payer: Self-pay | Admitting: Family Medicine

## 2011-04-08 ENCOUNTER — Telehealth (INDEPENDENT_AMBULATORY_CARE_PROVIDER_SITE_OTHER): Payer: Medicare Other | Admitting: Family Medicine

## 2011-04-08 ENCOUNTER — Other Ambulatory Visit (INDEPENDENT_AMBULATORY_CARE_PROVIDER_SITE_OTHER): Payer: Medicare Other | Admitting: Family Medicine

## 2011-04-08 DIAGNOSIS — E119 Type 2 diabetes mellitus without complications: Secondary | ICD-10-CM

## 2011-04-08 LAB — COMPREHENSIVE METABOLIC PANEL
ALT: 20 U/L (ref 0–35)
AST: 32 U/L (ref 0–37)
Albumin: 3.8 g/dL (ref 3.5–5.2)
BUN: 29 mg/dL — ABNORMAL HIGH (ref 6–23)
Calcium: 9.2 mg/dL (ref 8.4–10.5)
Chloride: 102 mEq/L (ref 96–112)
Potassium: 4.2 mEq/L (ref 3.5–5.1)
Sodium: 141 mEq/L (ref 135–145)
Total Protein: 7.4 g/dL (ref 6.0–8.3)

## 2011-04-08 LAB — LIPID PANEL
Cholesterol: 133 mg/dL (ref 0–200)
LDL Cholesterol: 66 mg/dL (ref 0–99)
Total CHOL/HDL Ratio: 3

## 2011-04-08 NOTE — H&P (Signed)
NAME:  Yvette Chavez, POZNANSKI                ACCOUNT NO.:  [id number]   MEDICAL RECORD NO.:  [id number]          PATIENT TYPE:  EMS   LOCATION:  ED                           FACILITY:  [facility]   PHYSICIAN:  Mobolaji B. Bakare, M.D.DATE OF BIRTH:  [birth year]   DATE OF ADMISSION:  02/28/2008  DATE OF DISCHARGE:                              HISTORY & PHYSICAL   PRIMARY CARE PHYSICIAN:  [facility]/ Dr. Amaryllis Dyke.   CHIEF COMPLAINT:  Fall.   HISTORY OF PRESENTING COMPLAINT:  Yvette Chavez a 60 year old Caucasian  female who was in her usual state of health, until this morning while  getting out of the bathroom she fell, and since then has not been able  to walk, to bear weight on the right lower extremity.  She was brought  to the emergency room.  The patient was found to have a tibial plateau  fracture, involving the right tibia.  She is being planned for open  reduction and internal fixation by Dr. Ranell Patrick.   The patient has history of diabetes mellitus, hypertension, morbid  obesity.  She describes no anginal symptoms.  She has been active at  home, as much as she can tolerate.  She is able to take care of a 2-year-  old granddaughter, and she runs around with her.  She denies chest pain  or shortness of breath.  The patient had a stress test done in January  2006, which was normal.   She was noted to have urinary tract infection by urinalysis and has been  started on IV ciprofloxacin.  Blood glucose was relatively low on BMET,  with blood glucose of 64.  The patient is asymptomatic.  A repeat CBG is  pending.   REVIEW OF SYSTEMS:  No fever, chills, shortness of breath.  The patient  endorses dysuria and foul-smelling urine.  No abdominal pain, nausea,  vomiting or diarrhea.   PAST MEDICAL HISTORY:  1. Diabetes mellitus.  2. Hypertension.  3. Hypercholesterolemia.  4. Asthma.  5. Colon cancer diagnosed in 1995.  She is status post radiation      therapy, chemotherapy and right  colectomy.  She has been following      up with Dr. Virginia Rochester and has had serial colonoscopy done.  She is due      for another colonoscopy this month.  6. Depression.  7. Gastroesophageal reflux disease.   PAST SURGICAL HISTORY:  1. Laminectomy by  Dr. Gerlene Fee.  2. Right colectomy for colon cancer.   CURRENT MEDICATIONS:  Include:  1. Aciphex 20 mg daily.  2. Actos 45 mg daily.  3. Aspirin 81 mg daily.  4. Benicar/hydrochlorothiazide 40/25 mg daily.  5. Crestor 20 mg daily.  6. Ferrous sulfate 35 mg daily.  7. Gabapentin 800 mg t.i.d.  8. Nitrostat p.r.n.  9. Phenergan p.r.n.  10.Januvia 100 mg daily.  11.Potassium chloride 10 mEq daily.  12.Sertraline 200 mg daily.  13.Singulair 10 mg daily.  14.Benadryl p.r.n.  15.Benzonatate p.r.n.  16.Torsemide p.r.n.Marland Kitchen   ALLERGIES:  CODEINE CAUSES A RASH.  SULFA CAUSES A RASH.   FAMILY HISTORY:  Both parents are deceased.  Mother passed away from  kidney failure, and the father passed away from complications of  emphysema.   SOCIAL HISTORY:  The patient is an ex-smoker.  She smoked for a brief  period.  She does not drink alcohol.  She is disabled, due to her back;  however, she is able to ambulate and does regular chores at home  including taking care of her 58-year-old granddaughter or grandchild.   EXAMINATION:  VITAL SIGNS:  The initial vitals:  Temperature 98.6, blood  pressure was 169/87, repeat blood pressure is now 138/67, pulse rate of  74, respiratory rate of 16, O2 sats of 94%.  GENERAL:  On examination, the patient is awake, alert, oriented in time,  place and person.  HEENT:  Normocephalic, atraumatic head.  Pupils equal, round and  reactive to light.  Extraocular muscle movement intact.  Mucous membrane  dry.  NECK:  No elevated JVD.  No carotid bruit.  LUNGS:  Clear clinically to auscultation.  CVS:  S1-S2, regular.  ABDOMEN:  Not distended, soft, nontender.  Bowel sounds present.  EXTREMITIES:  2+ pitting pedal edema,  dorsalis pedis pulses palpable  bilaterally 2+.  There is tenderness and swelling involving the right  knee.  CNS:  No focal neurological deficit.  Cranial nerves intact.   INITIAL LABORATORY DATA:  Sodium 142, potassium 3.8, chloride 107, CO2  28, glucose 64, BUN 20, creatinine 0.98, calcium 9.2, white cells 10.8,  hemoglobin 10.9, hematocrit 31.2.  The MCV is 77.7, platelets 280.  Absolute neutrophil count 8.2.  Urinalysis cloudy in appearance,  specific gravity of 1.036, large leukocyte esterase, creatinine greater  than 300, moderate blood.  Microscopy showed many bacteria.  White cell  of 11-20.   X-ray right hip:  Cannot exclude occult fracture of the greater  trochanter of the right hip.  CT scan of the right hip:  No definite  fracture is seen in the greater trochanter.  A questioned in the plain  films, there is a slight irregularity of the greater trochanter, but no  well-defined fracture line.  X-ray right knee showed comminuted fracture  with impaction of the right lateral tibia plateau.   EKG:  Unavailable.   ASSESSMENT AND PLAN:  1. Yvette Chavez is a 60 year old Caucasian female, presenting with a      fall.  She sustained fracture involving right tibial plateau.  She      has urinary tract infection.  She is diabetic, hypertensive and has      hyperlipidemia.  She had a negative stress test in 2006.  She has      no angina symptoms.  METS is greater than 4%.  Will check on EKG.      She can proceed to surgery if there is no abnormality on EKG.  2. Urinary tract infection.  Will continue ciprofloxacin for 400 mg IV      q.12 h., until culture is available.  3. Diabetes mellitus type 2.  Will check hemoglobin A1c.  The patient      will be n.p.o. for now and will monitor CBG q.4 h., cover only if      CBG is greater than 180.  She was hypoglycemic this morning.  The      patient stated that she used diabetic medications, but she did not      eat.  We will monitor blood  glucose closely and start on D5 normal      saline  at 125 cc/hour.  4. Microcytic anemia.  She will follow-up with Dr. Virginia Rochester for colonoscopy      this month.  5. Morbid obesity.  6. Asthma.  This is stable.  Will nebulize with Atrovent p.r.n..  7. Hypertension.  Will resume antihypertensive.  BUN and creatinine is      normal.      Mobolaji B. Corky Downs, M.D.  Electronically Signed     MBB/MEDQ  D:  02/28/2008  T:  02/28/2008  Job:  [id number]   cc:   c/o Dr. [facility]

## 2011-04-08 NOTE — Discharge Summary (Signed)
Yvette Chavez, Yvette Chavez                ACCOUNT NO.:  [id number]   MEDICAL RECORD NO.:  [id number]          PATIENT TYPE:  INP   LOCATION:  1342                         FACILITY:  [facility]   PHYSICIAN:  Hillery Aldo, M.D.   DATE OF BIRTH:  Sep 04, 1951   DATE OF ADMISSION:  02/28/2008  DATE OF DISCHARGE:  03/01/2008                               DISCHARGE SUMMARY   PRIMARY CARE PHYSICIAN:  Dr. Amaryllis Dyke of [facility].   DISCHARGE DIAGNOSES:  1. Fall sustaining a right tibial plateau fracture status post open      reduction internal fixation.  2. Pyuria, cultures sterile.  3. Transient prolonged QTC interval.  4. Uncontrolled diabetes.  5. Microcytic anemia, follow-up colonoscopy with Dr. Virginia Rochester planned  6. Asthma.  7. Morbid obesity.  8. Uncontrolled hypertension.  9. Dyslipidemia.  10.Depression.  11.History of colon cancer with colectomy.  12.Gastroesophageal reflux disease.   DISCHARGE MEDICATIONS:  1. Aciphex 20 mg q.p.m.  2. Actos 45 mg daily.  3. Aspirin 81 mg daily.  4. Benicar HCTZ 40/25 one tablet daily.  5. Crestor 20 mg daily.  6. Ferrous sulfate 325 mg daily.  7. Gabapentin 800 mg t.i.d.  8. Nitrostat 0.4 mg sublingual q. 5 minutes p.r.n.  9. Phenergan 25 mg q.4 h p.r.n.  10.Potassium chloride 10 mEq daily.  11.Sertraline 200 mg daily.  12.Singulair 10 mg daily.  13.Benadryl 25 mg q.4 h p.r.n.  14.Benzonatate 200 mg q.8 h p.r.n.  15.Torsemide 20 mg daily p.r.n. swelling.  16.Patanol eye drops one drop in each eye q.12 h p.r.n.  17.Lantus insulin 40 mg subcutaneously the night of March 01, 2008, and      then 80 units of Lantus q.h.s. thereafter.  18.Clonidine 0.1 mg b.i.d.  19.Percocet 5/325 1-2 tablets q.4 h p.r.n.  20.Lovenox 30 mg subcutaneously b.i.d.  21.Sliding scale insulin q.a.c. and h.s., insulin sensitive scale.   CONSULTATIONS:  Dr. Ranell Patrick of Orthopedic Surgery.   BRIEF ADMISSION HISTORY OF PRESENT ILLNESS:  The patient is a 60-year-  old morbidly obese female who presented after falling in the bathroom  and sustaining an immediate severe painful injury to her right upper  leg.  She was noted to have a deformity on initial assessment and upon  radiographic evaluation, was found to have a tibial plateau fracture.  She was admitted for operative intervention and stabilization.  For full  details, please see the dictated admission H&P done by Dr. Corky Downs.   PROCEDURES AND DIAGNOSTIC STUDIES:  1. Right hip films on February 28, 2008, showed no definite fracture in      three views, but the films could not completely exclude an occult      fracture of the greater trochanter of the right hip.  2. Four views of the right knee on February 28, 2008, showed comminuted      fracture with impaction of the right lateral tibial plateau.  3. CT scan of the right hip on February 28, 2008, showed no definite      fracture seen in the greater trochanter.  There was a slight  irregularity of the greater trochanter but no well-defined fraction      line  4. Right tibia/fibula films on February 28, 2008, showed anatomic      alignment status post open reduction internal fixation of the right      tibial plateau fracture.   DISCHARGE LABORATORY VALUES:  Urine cultures were negative.  TSH was  2.160.  Lipid profile showed a total cholesterol 116, triglycerides 85,  HDL 32, and LDL 67.  Magnesium was 1.6.  Sodium was 137, potassium 3.8,  chloride 102, bicarb 27, glucose 131, BUN 12, creatinine 0.83.  White  blood cell count was 11.3, hemoglobin 9.7, hematocrit 31.1, platelets  283 with an MCV value of 77.7.   HOSPITAL COURSE:  1. Fall with right tibial plateau fracture.  The patient underwent an      open reduction internal fixation with allograft bone grafting of      the right tibial plateau fracture by Dr. Ranell Patrick on February 28, 2008.      She participated in physical and occupational therapy      postoperatively and recommendations were that the  patient go to      short-term skilled nursing home for ongoing rehabilitation.  At      this time, she will be discharged on pain medications with ongoing      therapy at the skilled nursing facility.  2. Pyuria.  The patient was empirically put on antibiotics which were      discontinued when her urine cultures did not grow any significant      bacteria.  3. Prolonged QTC interval.  The patient did have an initial prolonged      QTC of 474 milliseconds.  A repeat EKG on March 01, 2008, showed a      QTC of 477.  A magnesium level was checked and found to be low and      was appropriately repleted.  Medications that prolong QTC should be      avoided in this patient, and she should have a follow-up magnesium      checked in a few days to insure that she has been appropriately      repleted.  4. Uncontrolled diabetes.  The patient's hemoglobin A1c was checked      and found to be 8.4%.  This indicates suboptimal outpatient      control.  She has been on 40 units of Lantus daily.  We also have      provided her with sliding scale insulin q.a.c. and q.h.s.  We are      going to resume her Actos at discharge, and she will need further      titration of her oral hypoglycemics if she is not to be continued      on sliding scale insulin.  5. Microcytic anemia.  The patient does have a microcytic anemia and      sees Dr. Virginia Rochester and apparently has been scheduled for an outpatient      colonoscopy.  She has been commenced on iron supplementation      therapy.  6. Asthma.  The patient's asthma has remained stable.  7. Morbid obesity.  The patient was maintained of carbohydrate      modified diet.  8. Uncontrolled hypertension.  The patient was started on clonidine      0.1 mg b.i.d..  She should have ongoing titration of her      medications as needed to achieve blood pressure  control.  9. Dyslipidemia.  The patient was continued on statin therapy.  10.Depression:  The patient was continued on  Zoloft.  11.History of colon cancer:  The patient should follow up with Dr.      Virginia Rochester, as outlined above.  12.Gastroesophageal reflux disease.  The patient was continued on      proton pump inhibitor therapy.   DISPOSITION:  The patient is medically stable for discharge to a skilled  nursing facility.  She should be commenced on a carbohydrate modified  diet given her history of diabetes.  Her right leg should be kept in an  immobilizer with physical and occupational therapy as tolerated.  She  will need her staples removed in one weeks time.  She should follow up  with Dr. Ranell Patrick of Orthopedic Surgery in two weeks time.  She should  continue with physical and occupational therapy.      Hillery Aldo, M.D.  Electronically Signed     CR/MEDQ  D:  03/01/2008  T:  03/01/2008  Job:  [id number]   cc:   [facility] Dr. Amaryllis Dyke

## 2011-04-08 NOTE — Op Note (Signed)
NAMESHENELL, ROGALSKI                ACCOUNT NO.:  [id number]   MEDICAL RECORD NO.:  [id number]          PATIENT TYPE:  INP   LOCATION:  0104                         FACILITY:  [facility]   PHYSICIAN:  Almedia Balls. Ranell Patrick, M.D. DATE OF BIRTH:  12-Jun-1951   DATE OF PROCEDURE:  02/28/2008  DATE OF DISCHARGE:                               OPERATIVE REPORT   PREOPERATIVE DIAGNOSIS:  Right tibial plateau fracture.   POSTOPERATIVE DIAGNOSIS:  Right tibial plateau fracture.   PROCEDURE PERFORMED:  Open reduction internal fixation with Allograft  bone grafting of right tibial plateau fracture.   ATTENDING SURGEON:  Almedia Balls. Ranell Patrick, M.D.   ASSISTANT:  [place]esto Charon, [place].   ANESTHESIA:  General anesthesia was used.   ESTIMATED BLOOD LOSS:  Less than 100 mL.   FLUID REPLACEMENT:  1500 mL.   URINE OUTPUT:  800 mL.   Instrument count was correct.   COMPLICATIONS:  None.   Preoperative antibiotics were given.   INDICATIONS:  The patient is a 60 year old morbidly obese female who  presents with a history of a fall in the bathroom.  The patient  complained of immediate severe pain and deformity to her knee.  The  patient presented to the emergency room where x-rays were done and  demonstrating a split depression lateral tibial plateau fracture.  The  patient had greater than 1 cm of depression of her lateral plateau and  incongruity to the joint.  Due to concern over future arthritis and  joint incongruity and poor function as well as progressive deformity it  was decided with the patient's permission to proceed with open reduction  internal fixation and bone grafting.  Informed consent was obtained.   DESCRIPTION OF PROCEDURE:  After an adequate level of anesthesia was  achieved the patient was positioned supine on the operating room table.  The patient was cleared medically for surgery prior to the surgical  procedure.  We placed the patient on a radiolucent table.  A nonsterile  tourniquet was placed on the right proximal thigh.  The right leg was  sterilely prepped and draped in the usual manner.  We did initially try  to exsanguinate the limb and elevate the tourniquet to 350 mmHg.  Unfortunately this created a venous tourniquet and the tourniquet was  deflated after only 3 minutes. I performed a dissection just off the  lateral side of the midline of the knee straight down onto the anterior  tibia.  Subperiosteal dissection of the proximal tibia was performed.  A  window was created in the tibial metaphysis.  This was used to push up  the depressed lateral plateau.  I did go ahead and divide the coronary  ligament and lift up the meniscus so as to stick my finger in the joint  and actually feel the reduction of the articular surface. This was  anatomic.   Once this was done we packed that back full of cancellous bone chips  recreating that lateral plateau surface.  We obtained an x-ray  demonstrating restoration of the joint line.  We then went ahead and  placed a lateral 2 hole poly-axe plate by DePuy up again that lateral  plateau using a King-Kong clamp to compress that and then placed locking  screws proximally and a combination of lagging and locking screws more  distally, gaining excellent purchase.  An X-ray was checked in multiple  planes confirming appropriate screw placement and length.   We were happy with the restoration of the joint line and the restoration  and stability of the knee.  We then thoroughly irrigated and closed the  fascial layer with interrupted zero Vicryl suture figure-of-eight  followed by 2-0 Vicryl subcutaneous closure and 4-0 Monocryl for the  skin.  Steri-Strips were applied followed by a sterile dressing.  The  patient tolerated surgery well.      Almedia Balls. Ranell Patrick, M.D.  Electronically Signed     SRN/MEDQ  D:  02/28/2008  T:  02/29/2008  Job:  [id number]

## 2011-04-08 NOTE — Telephone Encounter (Signed)
Message copied by Kerby Nora on Tue Apr 08, 2011  1:19 PM ------      Message from: Liane Comber      Created: Tue Apr 08, 2011  7:42 AM      Regarding: Cpx labs today!       Please order  future cpx labs for pt's upcomming lab appt.      Thanks      Rodney Booze

## 2011-04-09 ENCOUNTER — Telehealth: Payer: Self-pay | Admitting: Family Medicine

## 2011-04-09 DIAGNOSIS — E119 Type 2 diabetes mellitus without complications: Secondary | ICD-10-CM

## 2011-04-09 DIAGNOSIS — E7800 Pure hypercholesterolemia, unspecified: Secondary | ICD-10-CM

## 2011-04-09 DIAGNOSIS — I10 Essential (primary) hypertension: Secondary | ICD-10-CM

## 2011-04-09 NOTE — Telephone Encounter (Signed)
Message copied by Kerby Nora on Wed Apr 09, 2011 11:08 PM ------      Message from: Melody Comas      Created: Mon Apr 07, 2011  3:16 PM      Regarding: Lab orders       Patient is coming in tomorrow for cpx labs. Please put orders in. Thanks.

## 2011-04-10 ENCOUNTER — Encounter: Payer: Self-pay | Admitting: Family Medicine

## 2011-04-10 NOTE — Telephone Encounter (Signed)
Addended by: Liane Comber on: 04/10/2011 09:30 AM   Modules accepted: Orders

## 2011-04-10 NOTE — Telephone Encounter (Signed)
Pt had labs yesterday, no urine was collected so we will collect that tomorrow at her appt.

## 2011-04-11 ENCOUNTER — Ambulatory Visit (INDEPENDENT_AMBULATORY_CARE_PROVIDER_SITE_OTHER): Payer: Medicare Other | Admitting: Family Medicine

## 2011-04-11 ENCOUNTER — Encounter: Payer: Self-pay | Admitting: Family Medicine

## 2011-04-11 DIAGNOSIS — N189 Chronic kidney disease, unspecified: Secondary | ICD-10-CM

## 2011-04-11 DIAGNOSIS — I10 Essential (primary) hypertension: Secondary | ICD-10-CM

## 2011-04-11 DIAGNOSIS — R300 Dysuria: Secondary | ICD-10-CM

## 2011-04-11 DIAGNOSIS — E1149 Type 2 diabetes mellitus with other diabetic neurological complication: Secondary | ICD-10-CM

## 2011-04-11 DIAGNOSIS — E7800 Pure hypercholesterolemia, unspecified: Secondary | ICD-10-CM

## 2011-04-11 DIAGNOSIS — E119 Type 2 diabetes mellitus without complications: Secondary | ICD-10-CM

## 2011-04-11 LAB — URINALYSIS, ROUTINE W REFLEX MICROSCOPIC

## 2011-04-11 LAB — HM DIABETES FOOT EXAM

## 2011-04-11 MED ORDER — LIRAGLUTIDE 18 MG/3ML ~~LOC~~ SOLN: SUBCUTANEOUS | Status: DC

## 2011-04-11 NOTE — Patient Instructions (Addendum)
Recommend eye exam once a year. Work on improving diet and exercise as tolerate. Need to lose weight. Consider [employer]...for exercise program. Follow blood sugars twice daily and record, bring measurements to next appt. Follow up in 1 month to see how doing on Victoza. Continue other medication except Januvia. We will call you with culture results.

## 2011-04-11 NOTE — Op Note (Signed)
[place]  Patient:    Yvette Chavez, Yvette Chavez                 MRN: [id number] Proc. Date: 10/05/00 Adm. Date:  [id number] Attending:  Sabino Gasser CC:         Valentino Hue. Magrinat, M.D.   Operative Report  ADDENDUM:   [place] copy to [employer]. Magrinat, M.D. DD:  10/05/00 TD:  10/05/00 Job: [id number]

## 2011-04-11 NOTE — H&P (Signed)
NAME:  JERNIE, SCHUTT                          ACCOUNT NO.:  [id number]   MEDICAL RECORD NO.:  [id number]                   PATIENT TYPE:  INP   LOCATION:  0354                                 FACILITY:  [facility]   PHYSICIAN:  Sigmund I. Patsi Sears, M.D.         DATE OF BIRTH:  [birth year]   DATE OF ADMISSION:  05/31/2003  DATE OF DISCHARGE:                                HISTORY & PHYSICAL   HISTORY OF PRESENT ILLNESS:  Mrs. Micale is a 60 year old divorced white  female, disabled diabetic, recently seen April 27, 2003, with stress urinary  incontinence and microhematuria.  The patient had CT scan showing no  evidence of stones, with only a 5 cm simple cyst in the left kidney  identified.  No solid renal lesions were seen.  CT of the pelvis showed  radiation change from previous radiation and surgery.  No evidence of  recurrent colorectal cancer is identified.  No distal stones were seen.  Because of the patient's severe stress urinary incontinence with positive  Gaynell Face test and positive Q-tip test, she was considered for surgical  repair of her urinary incontinence.  She is admitted to the hospital prior  to her surgery because of her multiple medical illnesses, a need of  preoperative antibiotic, and skin preparation.   PAST MEDICAL HISTORY:  1. Colorectal cancer in 1995, treated with surgery, chemotherapy, and     radiation therapy.  2. 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  3. Back injury at work with acute lumbar disk disease complicated by     Staphylococcus infection, treated with IV antibiotics for one year     (vancomycin).  4. Breathing disorder (asthma).  5. Chronic anemia.   TOBACCO/ALCOHOL:  Cigarettes, none.  Alcohol, none.   FAMILY HISTORY:  Significant for hypertension and diabetes.   SOCIAL HISTORY:  The patient currently lives by herself.  She has lived with  her sister, who is currently in a rehabilitation  facility.   ALLERGIES:  CODEINE and SULFA.   MEDICATIONS:  1. OxyContin 20 mg three times a day for chronic pain.  2. Neurontin 800 mg three times a day for chronic pain.  3. Enalapril/hydrochlorothiazide 10/25 mg, 1 per day.  4. Advair inhaler 1 puff per day.  5. Albuterol inhaler p.r.n.  6. Nasonex 50 mg two times a day.  7. Zoloft 100 mg per day.  8. Lipitor 10 mg per day.  9. Avandia 8 mg, 1 per day.  10.      Humalog insulin per sliding scale.  11.      Lantus insulin 86 units h.s.   PHYSICAL EXAMINATION:  GENERAL:  Obese white female in no acute distress.  HEENT:  PERRL.  EOMs full.  NECK:  Supple.  Nontender.  No nodes.  CHEST:  Clear to P&A.  BREASTS:  Not applicable.  COR:  S1, S2 normal  without heaves, thrills, or murmurs.  ABDOMEN:  Soft.  Positive bowel sounds.  Without organomegaly.  Without  masses.  Well-healed abdominal incision.  GENITOURINARY:  The patient has positive Marshall test and positive Q-tip  test.  She has grade 2 cystourethrocele which may not need repair.  Decision  will be made at the time of surgery.  EXTREMITIES:  Without cyanosis or edema.  NEUROLOGIC:  Physiologic.  The patient has known peripheral neuropathy.   IMPRESSION:  Diabetic with stress urinary incontinence, for pubovaginal  sling in the a.m.   PLAN:  1. Admit to the hospital for skin preparation with Hibiclens shower and     scrub.  2. IV antibiotics with vancomycin preoperatively.  3. Incentive spirometry teaching preoperatively.  4. IV antibiotics with half normal saline preoperatively for hydration.  5. Continuation of chronic medications.   I expect the patient to be admitted for one postoperative day.  It is her  discharge anticipation as well.                                               Sigmund I. Patsi Sears, M.D.    SIT/MEDQ  D:  05/31/2003  T:  05/31/2003  Job:  [id number]   cc:   Brooke Bonito, M.D.  [address] [employer] [address]  [place]  [place] [address]  Fax:  [phone]

## 2011-04-11 NOTE — Procedures (Signed)
[facility]  Patient:    Yvette Chavez, Yvette Chavez                 MRN: [id number] Proc. Date: 01/05/01 Adm. Date:  [id number] Attending:  Thyra Breed CC:         Reinaldo Meeker, M.D.   Procedure Report  PROCEDURE:  Lumbar epidural steroid injection.  DIAGNOSES:  Lumbar degenerative disk disease with epidural scarring on the left S1 and S2 nerve roots with associated radiculopathy.  HISTORY OF PRESENT ILLNESS:  The patient has noted overall improvement with her first injection.  She presents today for repeat injection.  Her blood sugars have remained under good control.  PHYSICAL EXAMINATION:  Blood pressure 156/86, respiratory rate is 20, heart rate is 71, O2 saturations are 98%, pain was 5/10, temperature 98.  She shows good healing of her back from previous injection site.  PROCEDURE:  After informed consent was obtained the patient was placed in the sitting position in a monitored bed.  Her back was prepped with Betadine x 3. The skin wheal was raised at the L3-4 interspace with 1% lidocaine.  A 20 gauge Tuohy needle was introduced in the lumbar epidural space and loss of resistance, ______ normal saline.  There is no cerebrospinal fluid in her blood.  Medrol 40 mg in 8 ml preservative free normal saline was gently injected.  The needle was flushed with preservative free normal saline and removed intact.  Her post procedure condition is stable.  DISCHARGE INSTRUCTIONS: 1. Resume previous diet. 2. Limitation of activities per instruction sheet as outlined by my    assistant today. 3. Continue current medications. 4. The patient plans to contact Dr. Gerlene Fee to let him know that she is    responding positively.  She asked about going ahead and scheduling    another epidural.  I have advised her to check with him. DD:  01/05/01 TD:  01/05/01 Job: [id number]

## 2011-04-11 NOTE — Procedures (Signed)
[facility]  Patient:    Yvette Chavez, Yvette Chavez                 MRN: [id number] Proc. Date: 12/30/00 Adm. Date:  [id number] Attending:  Thyra Breed CC:         Reinaldo Meeker, M.D.  Lilia Pro, M.D.   Procedure Report  DATE OF BIRTH:  07/08/1951  PROCEDURE:  Lumbar epidural steroid injection.  DIAGNOSIS:  Lumbar degenerative disk disease with epidural scarring around the left S1 and S2 nerve roots with associated radiculopathy.  HISTORY OF PRESENT ILLNESS:   Yvette Chavez is a very pleasant 60 year old who is sent to [place] by Dr. Gerlene Fee for a series of two lumbar epidural steroid injections.  The patient states that she was in her usual state of health up until a work-related injury in October 2000.  She was treated conservatively but continued to have pain and underwent a disk operation in February 2001. Postoperatively this was complicated by a staph infection which required three subsequent surgeries, ultimately with delayed healing procedure. Postoperatively she had no pain from her last operation in August 2001.  She was doing well up until about four weeks ago when she noted the onset of a throbbing lower back discomfort with radiation out into the left lower extremity.  Because of her infection, she was followed by Dr. Maurice March, who was treating her with antibiotics up until two weeks ago but felt she was infection free at that time.  She has had a persistent throbbing discomfort, radiating from the low back into the left buttock and down the posterior aspect of the left leg for the past four weeks which is made worse by walking, sitting for long periods of time, standing for long periods of time, or having bowel movements.  It is improved by lying on her right side.  Her numbness and tingling is intermittent in the radicular distribution.  She has a persistent numbness and tingling of her feet which she relates to diabetic  peripheral neuropathy.  She has some give-away weakness of her left lower extremity but no persistent weakness.  She denied any bowel or bladder incontinence.  She has been treated with Vicodin which helps when she takes it around the clock. She also takes Klonopin and Neurontin for peripheral neuropathy.  She has not had any physical therapy.  CURRENT MEDICATIONS:  1. Enalapril 20 mg a day.  2. HCTZ 25 mg per day.  3. Prempro 2.5 mg per day.  4. Neurontin 300 mg q.6h.  5. Klonopin 0.5 mg at bedtime.  6. Avandia.  7. Zoloft.  8. Iron supplement.  9. Vicodin 5/500 3 times a day. 10. Prilosec. 11. Insulin. 12. Multivitamin.  ALLERGIES:  SULFONAMIDES, CODEINE characterized by hives.  FAMILY HISTORY:  Positive for hypertension, diabetes, and cancer.  PAST SURGICAL HISTORY:  Significant for resection of a colorectal cancer, C-section, hammertoe surgery, and her back surgeries x 4, as mentioned above.  SOCIAL HISTORY:  The patient is a 3 cigarette per day smoker.  She does not drink alcohol.  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  ACTIVE MEDICAL PROBLEMS:  Hypertension, history of colon cancer, diabetes mellitus complicated by peripheral neuropathy, and gastroesophageal reflux disease .  REVIEW OF SYSTEMS:  GENERAL:  Negative.  HEAD:  Significant for rare headaches.  EYES:  Significant for glasses.  NOSE/MOUTH/THROAT:  Significant for allergic rhinitis  and sinusitis.  EARS:  Negative.  PULMONARY:  Negative. CARDIOVASCULAR:  Hypertension x 10 years.  GI:  History of colon cancer and symptoms of gastroesophageal reflux disease, otherwise negative.  GU: Negative.  MUSCULOSKELETAL:  The patient has carpal tunnel syndrome of her hands but does not want surgical intervention.  HEMATOLOGIC:  She has anemia since her surgery.  CUTANEOUS:  Negative.  ENDOCRINE:  Positive for diabetes. No thyroid problems per patient.   PSYCHIATRIC:  Positive for depression.  She is currently on Zoloft.  ALLERGIES:  Positive for molds, dust, and history of hyposensitivity treatments.  PHYSICAL EXAMINATION:  VITAL SIGNS:  Blood pressure 184/80, heart rate 76, respiratory rate 20, O2 saturations 96%.  Temperature is 98.1.  Pain level is 9/10.  GENERAL:  This is a pleasant female in no acute distress.  HEENT:  Head is normocephalic, atraumatic.  Eyes with extraocular movements intact with conjunctive sclerae clear.  Nose with patent nares.  Oropharynx is free of lesions.  NECK:  Neck is supple without lymphadenopathy.  Carotids are 2+ and symmetric without bruits.  LUNGS:  Lungs are clear.  HEART:  Heart is regular rate and rhythm.  BREASTS/ABDOMINAL/PELVIC/RECTAL:  Examinations are not performed.  BACK:  Back exam reveals a well healed deep scar in her lower lumbar regions with accentuated kyphosis of the dorsal spine.  Straight leg raise signs are negative.  EXTREMITIES:  The patient demonstrates positive Tinel signs bilaterally with radial pulses and dorsalis pedis pulses 2+ and symmetric.  NEUROLOGIC:  The patient is oriented x 4.  Cranial nerves 2-12 are grossly intact.  Deep tendon reflexes are symmetric in the upper extremities, 2+ at the knees, mute at the ankles, with plantar reflexes mute.  Motor is significant for symmetric bulk and tone and grossly intact.  Coordination is grossly intact.  Sensory exam reveals a stocking distribution attenuation to pinprick in the lower extremities.  An MRI interpretation from December 05, 2000 showed evidence of prior surgery at L5-S1 by interpretation by Dr. Hulen Luster.  He noted there was a fairly prominent scar to the left thecal sac around the left S1 and S2 nerve roots with continuing enhancement of the left S1 suggesting inflammation.  There was resolution of fluid collections and no evidence of discitis or other  infections at this time.  No epidural abscess  or fluid collections were seen. There was evidence of degenerative disk disease at L5 and S1.  IMPRESSION: 1. Low back pain radiating out into the right lower extremity, most likely on    the basis of epidural scarring from her previous surgery and underlying    degenerative disk disease. 2. Other medical problems per Dr. Johnsie Kindred, her primary care physician, which    include diabetes, hypertension, peripheral neuropathy, gastroesophageal    reflux disease, and history of colon cancer as well as allergic symptoms.  DISPOSITION:  I discussed the potential risks, benefits, and limitations of a lumbar epidural steroid injection in detail with the patient as well as reviewed the side effects of corticosteroids.  She is aware of the limitations of the procedure as well as the risk of infection.  DESCRIPTION OF PROCEDURE:  After informed consent was obtained, the patient was placed in a sitting position and monitored.  Her back was prepped with Betadine x 3.  A skin wheal was raised at the L3-L4 interspace with 1% lidocaine.  A 20 gauge Tuohy needle was introduced to the lumbar epidural space to loss of resistance to preservative-free normal saline.  There was no CSF nor blood.  Medrol 40 mg in 8 mL of preservative-free normal saline was gently injected.  The needle was flushed with preservative-free normal saline and removed intact.  Postprocedure condition - stable.  DISCHARGE INSTRUCTIONS: 1. Resume previous diet and follow blood sugars closely. 2. Limitations on activities per instruction sheet, as outlined by my    assistant today. 3. Continue on current medications. 4. Follow up with me in one to two weeks for a second injection. DD:  12/30/00 TD:  12/31/00 Job: [id number]

## 2011-04-11 NOTE — Op Note (Signed)
[facility]  Patient:    Yvette Chavez, Yvette Chavez                       MRN: [id number] Proc. Date: 07/03/00 Adm. Date:  [id number] Attending:  Gerald Dexter                           Operative Report  PREOPERATIVE DIAGNOSIS:  Superficial wound infection.  POSTOPERATIVE DIAGNOSIS:  Superficial wound infection.  OPERATION PERFORMED:  Incision and drainage of superficial wound infection.  SURGEON:  Reinaldo Meeker, M.D.  ASSISTANT:  Bradd Canary., M.D.  ANESTHESIA:  DESCRIPTION OF PROCEDURE:  After being placed in the prone position, the patients back was prepped and draped in the usual sterile fashion.  The previous lumbar wound was reopened.  There was a small amount of possibly purulent fluid but nothing extending down on deep through the soft tissues. There was a very small sinus tract to the right side of the incision and this was incised.  The incision was carried down to the fascia.  There was still no evidence of an abscess pocket to be identified.  The fascia was partially incised to see if there was any evidence of any sort of infection tract and none could be identified.  It was elected not to open the wound further down. It was then copiously irrigated with antibiotic irrigation.  Rather than close the wound however, it was elected to pack it with wet-to-dry gauze so that the wound would granulate up from its base.  The patient was then extubated and taken to the recovery room in stable condition. DD:  07/03/00 TD:  07/03/00 Job: [id number]

## 2011-04-11 NOTE — Telephone Encounter (Signed)
Addended by: Melody Comas on: 04/11/2011 05:02 PM   Modules accepted: Orders

## 2011-04-11 NOTE — Progress Notes (Signed)
Subjective:    Patient ID: Yvette Chavez, female    DOB: 1951/06/13, 60 y.o.   MRN: [id number]  HPI She has not been seen in last 6 months.  She has the following chronic issues:  Dx with colon adenocarcinoma in 1995...has not seen ONC in 7 years.  S/P radiation, chem and colon resection.   Chronic back pain, B carpal tunnel, peripheral neuropathy... Seen by Dr. Thyra Breed at pain Center...  On Gapapentin and OPANA..Well controlled on these meds but has not been taking Opana lately because cannot afford to see him.  Depression, well controlled on sertraline daily.  No SI.  Diabetes:  Poor control, last a1c 9.4 on januvia, actos and lantus 80 units daily  Has sliding scale humalog to use but not using this. Using medications without difficulties:Yes Hypoglycemic episodes:One measurement at 35 when had ulcers in feet 4-5 months ago. Hyperglycemic episodes:Yes Feet problems: Ulcers on legs healed at wound care center. Blood Sugars averaging: Checking 2 times a day.. Running 124, postprandially 202 Eye exam within last year: None Metformin contraindicated due to kidney insuff. And also had SE to metformin (GI)  Elevated Cholesterol: on crestor Using medications without problems: Muscle aches: None   Hypertension: Well controlled.   Using medication without problems or lightheadedness:  Chest pain with exertion: None Edema:None Short of breath:None Average home BPs: Not checking at home.   :  Review of Systems  Constitutional: Negative for fever and fatigue.  HENT: Negative for rhinorrhea and sinus pressure.   Respiratory: Negative for shortness of breath and wheezing.   Cardiovascular: Negative for chest pain, palpitations and leg swelling.  Gastrointestinal: Negative for nausea, diarrhea and constipation.  Genitourinary: Positive for dysuria. Negative for urgency, frequency and vaginal bleeding.       [Burning with urination x 3 days. Musculoskeletal:       [Neck  stiffness Shoulder pain  On left.      Objective:   Physical Exam  Constitutional: Vital signs are normal. She appears well-developed and well-nourished. She is cooperative.  Non-toxic appearance. She does not appear ill. No distress.  HENT:  Head: Normocephalic.  Right Ear: Hearing, tympanic membrane, external ear and ear canal normal. Tympanic membrane is not erythematous, not retracted and not bulging.  Left Ear: Hearing, tympanic membrane, external ear and ear canal normal. Tympanic membrane is not erythematous, not retracted and not bulging.  Nose: No mucosal edema or rhinorrhea. Right sinus exhibits no maxillary sinus tenderness and no frontal sinus tenderness. Left sinus exhibits no maxillary sinus tenderness and no frontal sinus tenderness.  Mouth/Throat: Uvula is midline, oropharynx is clear and moist and mucous membranes are normal.  Eyes: Conjunctivae, EOM and lids are normal. Pupils are equal, round, and reactive to light. No foreign bodies found.  Neck: Trachea normal and normal range of motion. Neck supple. Carotid bruit is not present. No mass and no thyromegaly present.  Cardiovascular: Normal rate, regular rhythm, S1 normal, S2 normal, normal heart sounds, intact distal pulses and normal pulses.  Exam reveals no gallop and no friction rub.   No murmur heard. Pulmonary/Chest: Effort normal and breath sounds normal. Not tachypneic. No respiratory distress. She has no decreased breath sounds. She has no wheezes. She has no rhonchi. She has no rales.  Abdominal: Soft. Normal appearance and bowel sounds are normal. There is no tenderness.  Neurological: She is alert.  Skin: Skin is warm, dry and intact. No rash noted.  Psychiatric: Her speech is normal and behavior  is normal. Judgment and thought content normal. Her mood appears not anxious. Cognition and memory are normal. She does not exhibit a depressed mood.      Diabetic foot exam: Varicose veins  Left great toe base..skin  breakdown B calluses  Normal DP pulses Normal sensation to light tough and monofilament Nails thickened Healed ulcers B     Assessment & Plan:   Pt not ready for annual medicare wellness, will return for this at next OV. Due for mammo, pap etc.

## 2011-04-11 NOTE — Discharge Summary (Signed)
[facility]  Patient:    HERBERT, AGUINALDO                       MRN: [id number] Adm. Date:  [id number] Disc. Date: [id number] Attending:  Aldean Baker V                           Discharge Summary  PRIMARY DIAGNOSIS:  Superficial wound infection.  HISTORY OF PRESENT ILLNESS/HOSPITAL COURSE:  Ms. Gerhart is a 60 year old female who had back surgery one month prior to this admission.  Subsequently she developed some drainage from her incision and was treated with a round of antibiotics without improvement.  It is elected at this time to admit her for IV antibiotics and possible denude the incision.  The patient was admitted on May 05, 2000, for IV antibiotics.  On subsequent days she was treated with aggressive IV antibiotics and the redness and discharge decreased significantly.  By May 11, 2000, she was up ambulating well.  She had had a PICC line placed. She was tolerating her antibiotic.  It was felt that she could be discharged home with outpatient antibiotics.  CONDITION:  Improve versus admission. DD:  10/29/00 TD:  10/29/00 Job: [id number]

## 2011-04-11 NOTE — Assessment & Plan Note (Addendum)
Appears to be at baseline 1.3-1.5.  On ARB

## 2011-04-11 NOTE — Assessment & Plan Note (Signed)
Contaminated with epithelials. Minimal symptoms. Will eval with Urine culture but hold on antibiotics for now.

## 2011-04-11 NOTE — Procedures (Signed)
[facility]  Patient:    Yvette Chavez, Yvette Chavez                 MRN: [id number] Proc. Date: 10/02/00 Adm. Date:  [id number] Attending:  Sabino Gasser CC:         Valentino Hue. Magrinat, M.D.   Procedure Report  PROCEDURE:  Upper endoscopy.  INDICATION FOR PROCEDURE:  Iron deficiency, heme positive anemia.  ANESTHESIA:  Demerol 60 mg, Versed 6 mg.  DESCRIPTION OF PROCEDURE:  With the patient mildly sedated in the left lateral decubitus position, the Olympus video endoscope was inserted in the mouth and passed under direct vision through the esophagus which appeared normal into the stomach which grossly appeared normal at first glance. We advanced to the body, antrum, duodenal bulb, and second portion of the duodenum all of which appeared normal. From this point, the endoscope was slowly withdrawn taking circumferential views of the entire duodenal mucosa until the endoscope was then pulled back into the stomach, placed in retroflexion to view the stomach from below and this appeared normal and was photographed. The endoscope was straightened and withdrawn taking circumferential views of the remaining gastric and esophageal mucosa stopping only in the fundus of the stomach posteriorly where there is a patchy area of questionable gastritis where the mucosa appeared somewhat thickened and snake skinned. This was photographed and biopsied. The endoscope was withdrawn. The patients vital signs and pulse oximeter remained stable. The patient tolerated the procedure well and there were no apparent complications.  FINDINGS:  Localized patch of questionable gastritis in the fundus. Await biopsy report. The patient will call me for results and follow-up with me as an outpatient. Proceed to colonoscopy as planned. DD:  10/02/00 TD:  10/03/00 Job: [id number]

## 2011-04-11 NOTE — Op Note (Signed)
NAME:  TANAE, PETROSKY                          ACCOUNT NO.:  [id number]   MEDICAL RECORD NO.:  [id number]                   PATIENT TYPE:  INP   LOCATION:  0354                                 FACILITY:  [facility]   PHYSICIAN:  Sigmund I. Patsi Sears, M.D.         DATE OF BIRTH:  01-02-1951   DATE OF PROCEDURE:  DATE OF DISCHARGE:                                 OPERATIVE REPORT   PREOPERATIVE DIAGNOSIS:  Urinary incontinence.   POSTOPERATIVE DIAGNOSIS:  1. Urinary incontinence.  2. Urethral caruncle.   OPERATION:  1. Pubovaginal sling with mentor Transobturator tape.  2. Anterior vaginal vault repair for anterior pelvic prolapse.  3. Reinforcement of repairs with Pelvi-soft (bard).  4. Excision of urethral caruncle.   SURGEON:  Sigmund I. Patsi Sears, M.D.   ANESTHESIA:  General endotracheal.   PREPARATION:  After appropriate preanesthesia, the patient was brought to  the operating room, placed on the operating table in dorsal supine position  where general LMA anesthesia was introduced. She was then replaced in the  dorsal lithotomy position where the pubis was prepped with Betadine solution  and draped in the usual fashion.   DESCRIPTION OF PROCEDURE:  A 1.5 cm incision was made over a scarred vaginal  vault over the urethra, subcutaneous tissue was carefully dissected  bilaterally. Using a marking pen, a 5 cm mark is made bilaterally at the  level of clitoris, and stab wound is made. The mentor obturator tape is then  brought into the wound, and placed in a retrograde fashion using the special  instrumentation, and with a right angle behind for retentioning, the  Transobturator tape is cut subcutaneously. Cystoscopy shows no evidence of  any tape within the bladder. The vaginal urethral wound is reinforced with  Pelvi-soft, and closed in two layers. The vertical incision is closed  horizontally carefully with simple sutures.   Attention is then directed to the anterior  vaginal vault repair. The  incision is then made from the area of scar from hysterectomy, to a point 1  cm from the urethra. Subcutaneous tissue was sharply and bluntly dissected.  The area of cardinal ligament is identified, and horizontal mattress 2-0  Vicryl suture is placed, and then anterior vaginal vault is accomplished  with horizontal mattress Kelly plication. This repair is reinforced with  Pelvi-soft, measuring approximately 1 cm x 2 cm. This is suture in the wound  at the 11 and 1 o'clock positions. Following this, vaginal closure is  accomplished in two layers with 3-0 Vicryl suture. Packing is placed.  Attention is directed to a medium sized caruncle, and this is excised using  the electrosurgical unit. Three separate 4-0 Vicryl sutures are then used to  close the urethral mucosa, and a fourth is used to close the 6 o'clock  position of the urethral mucosa to the vagina. The patient was then  awakened. She was given IV Toradol, as well as a B&O  suppository and taken  to the recovery room in good condition.                                               Sigmund I. Patsi Sears, M.D.    SIT/MEDQ  D:  06/01/2003  T:  06/01/2003  Job:  [id number]

## 2011-04-11 NOTE — Assessment & Plan Note (Signed)
Well controlled. Continue current medication.  

## 2011-04-11 NOTE — Op Note (Signed)
   NAME:  Yvette Chavez, Yvette Chavez                          ACCOUNT NO.:  [id number]   MEDICAL RECORD NO.:  [id number]                   PATIENT TYPE:  AMB   LOCATION:  ENDO                                 FACILITY:  [facility]   PHYSICIAN:  Georgiana Spinner, M.D.                 DATE OF BIRTH:  Dec 06, 1950   DATE OF PROCEDURE:  DATE OF DISCHARGE:                                 OPERATIVE REPORT   PROCEDURE:  Colonoscopy.   INDICATIONS FOR PROCEDURE:  Followup of colon cancer.   ANESTHESIA:  Demerol 100, Versed 8 mg.   DESCRIPTION OF PROCEDURE:  With the patient mildly sedated in the left  lateral decubitus position, the Olympus videoscopic colonoscope was inserted  in the rectum and passed under direct vision to the cecum identified by the  ileocecal valve and appendiceal orifice both of which were photographed.  From this point, the colonoscope was slowly withdrawn taking circumferential  views of the entire colonic mucosa stopping only in the rectum which  appeared normal in direct view and on retroflexed view we did not see much,  took a photograph and then straightened the endoscope and pulled through the  anal canal which showed hemorrhoids.  The endoscope was withdrawn. The  patient's vital signs and pulse oximeter remained stable. The patient  tolerated the procedure well without apparent complications.   FINDINGS:  Internal hemorrhoids, otherwise, unremarkable colonoscopic  examination to the cecum.   PLAN:  Repeat examination now in five years.                                                Georgiana Spinner, M.D.    GMO/MEDQ  D:  02/24/2003  T:  02/24/2003  Job:  [id number]

## 2011-04-11 NOTE — Discharge Summary (Signed)
NAME:  Yvette Chavez, Yvette Chavez                          ACCOUNT NO.:  [id number]   MEDICAL RECORD NO.:  [id number]                   PATIENT TYPE:  INP   LOCATION:  0354                                 FACILITY:  [facility]   PHYSICIAN:  Sigmund I. Patsi Sears, M.D.         DATE OF BIRTH:  1951-02-03   DATE OF ADMISSION:  05/31/2003  DATE OF DISCHARGE:  06/02/2003                                 DISCHARGE SUMMARY   FINAL DIAGNOSES:  1. Stress urinary incontinence.  2. Cystourethrocele.  3. Colorectal cancer.  4. 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  5. History of acute lumbar disk disease.  6. Staph lumbar disk infection.  7. Asthma.  8. Chronic anemia.   OPERATIONS THIS ADMISSION:  On June 01, 2003, cystourethrocele repair and  pubovaginal sling.   HISTORY OF PRESENT ILLNESS:  Yvette Chavez is a 60 year old divorced white  female, disabled diabetic, seen in June 2004, with stress urinary  incontinence, and microhematuria.  Evaluation for hematuria with CT scan  showed no evidence of stone disease, with only a 5 cm simple cyst found in  the left kidney.  There was no solid renal lesion seen.  CT of the pelvis  showed radiation change from previous surgery and radiation, but no evidence  of recurrent colorectal disease was noted.  No stones were identified.   Because of the patient's severe stress incontinence, with positive Gaynell Face  test and positive __________ test, she was considered for surgical repair of  her urinary incontinence.  She was admitted to the hospital prior to surgery  for evaluation of her multiple medical problems, and the need of  preoperative antibiotic and skin preparation.   PAST MEDICAL HISTORY:  1. Colorectal cancer, 1995, treated with surgery, chemotherapy and radiation     therapy.  2. Peripheral neuropathy, secondary to chemotherapy and radiation therapy     for above.  3. Back injury at work with acute lumbar disk disease,  complicated by Staph     infection, treated with IV antibiotics (vancomycin).  4. History of asthma.  5. History of chronic anemia.   HABITS:  Tobacco and alcohol:  None.   FAMILY HISTORY:  Significant for hypertension, diabetes.   SOCIAL HISTORY:  The patient currently lives by herself.  She had previously  lived with her sister, who currently is in a rehabilitation facility.   ALLERGIES:  1. CODEINE.  2. SULFA.   MEDICATIONS:  1. OxyContin 20 mg t.i.d. for chronic pain.  2. Neurontin 800 mg t.i.d. for chronic pain.  3. Enalapril/hydrochlorothiazide 10/25 mg one daily.  4. Advair inhaler one puff daily.  5. Albuterol inhaler p.r.n.  6. Nasonex 50 mg b.i.d.  7. Zoloft 100 mg daily.  8. Lipitor 100 mg daily.  9. Avandia 8 mg one daily.  10.      Humalog insulin per sliding scale.  11.      Lantus insulin 86 units  h.s.   PHYSICAL EXAMINATION:  GENERAL:  An obese white female in no acute distress.  VITAL SIGNS:  Blood pressure is 129/62, temperature 98.3, heart rate 90,  respiratory rate 18, weight is 220 pounds.  O2 sat is 98%, and CBG is 260.   The remaining physical examination is as noted on dictated H&P of May 31, 2003.   ADMISSION LABORATORY DATA:  Sodium 137, potassium 3.6, CO2 of 30, chloride  102, BUN 27, creatinine 0.8, random glucose 203, serum calcium 9.2.  Hemoglobin 9.5, hematocrit 28.3, white blood cell count 6900, platelet count  235,000.   Chest x-ray showed prominent heart size with bilateral basilar atelectasis.  EKG shows normal sinus rhythm with LVH, and age indeterminate anterior  infarct.   HOSPITAL COURSE:  On the day of admission, the patient underwent intravenous  vancomycin antibiotics, and shower with Hibiclens surgical scrub with  particular attention to the pelvis, pubic area, and labia.   On the following day, the patient underwent implantation of a Mentor  transobturator vaginal sling for control of urinary incontinence, and   cystourethrocele repair.  On postoperative day #1, the catheter was removed,  the patient was able to void x2 and was ready for discharge.  She is  afebrile, feeling well.  She is discharged on Cipro 500 mg b.i.d. for seven  days.  She is already on chronic pain medication.   CONDITION ON DISCHARGE:  She is discharged in stable condition.   FOLLOWUP:  She will return to the office per previous appointment.                                               Sigmund I. Patsi Sears, M.D.    SIT/MEDQ  D:  06/02/2003  T:  06/02/2003  Job:  [id number]

## 2011-04-11 NOTE — Assessment & Plan Note (Signed)
Well-controlled on current meds 

## 2011-04-11 NOTE — Op Note (Signed)
[place]. [facility]  Patient:    Yvette Chavez, Yvette Chavez                       MRN: [id number] Proc. Date: 03/30/00 Adm. Date:  [id number] Disc. Date: [id number] Attending:  Gerald Dexter                           Operative Report  PREOPERATIVE DIAGNOSIS:  Superficial wound infection.  POSTOPERATIVE DIAGNOSIS:  Superficial wound infection.  PROCEDURE:  Exploration of lumbar wound with irrigation and reclosure.  SURGEON:  Reinaldo Meeker, M.D.  ANESTHESIA:  DESCRIPTION OF PROCEDURE:  After being placed in the prone position, the patients back was prepped and draped in the usual sterile fashion.  The previous lumbar incision was opened and carried down to the fascia.  A pocket was loculated fluid was encountered and cultures were taken.  There was no evidence of any extension of the infection through the fascial layer.  Any granulation-type tissue in the superficial subcutaneous layer was removed.  The power irrigator was then used or approximately 3 L of fluid.  At this point, tissues looked good.  Closure was then carried out with some deep layers of interrupted Vicryl stitches followed by large running locking nylon on the skin and subcuticular layer.  A sterile dressing was applied.  The patient was extubated and taken to the recovery room in stable condition. DD:  03/30/00 TD:  03/31/00 Job: [id number]

## 2011-04-11 NOTE — Assessment & Plan Note (Signed)
Poor control, pt feels using sliding scale insulin did not help her much in past. Wishes instead to try something new. Metformin cause SE and she has CRF. Will start victoza daily. Follow up in 1 month.

## 2011-04-11 NOTE — Op Note (Signed)
[place]. [facility]  Patient:    Yvette Chavez, Yvette Chavez                 MRN: [id number] Proc. Date: 10/05/00 Adm. Date:  [id number] Attending:  Sabino Gasser                           Operative Report  PROCEDURE:  Colonoscopy.  SURGEON:  Sabino Gasser, M.D.  INDICATIONS:  Cancer.  ANESTHESIA:  Demerol 100 mg, Versed 10 mg.  DESCRIPTION OF PROCEDURE:  Patient mildly sedated in the left lateral decubitus position.  The Olympus videoscopic colonoscope was inserted into the rectum and passed under direct vision to the cecum.  The cecum identified by ileocecal valve, appendiceal orifice, both of which were photographed.  We entered into the terminal ileum which also appeared normal.  From this point the colonoscope was slowly withdrawn, taking circumferential views of the entire colonic mucosa, stopping only in the rectum which appeared normal on direct view, and showed hemorrhoids.  As we pulled through the anal canal, retroflex view once again could not be obtained due to the small size of the rectum.  Endoscope was withdrawn.  Patients vital signs, pulse oximetry remained stable.  Patient tolerated the procedure well without apparent complications.  FINDINGS:  External hemorrhoids, essentially otherwise unremarkable examination in follow-up of colon cancer.  PLAN:  Repeat examination in one to two years. DD:  10/05/00 TD:  10/05/00 Job: [id number]

## 2011-04-11 NOTE — Procedures (Signed)
[facility]  Patient:    ACHAIA, Yvette Chavez                 MRN: [id number] Proc. Date: 10/02/00 Adm. Date:  [id number] Disc. Date: [id number] Attending:  Sabino Gasser CC:         Valentino Hue. Magrinat, M.D.   Procedure Report  PROCEDURE:  Colonoscopy.  INDICATIONS:  Hemoccult positivity, iron deficiency.  ANESTHESIA:  Demerol 20 mg, Versed 2 mg,  DESCRIPTION OF PROCEDURE:  With the patient mildly sedated in the left lateral decubitus position, the Olympus videoscopic colonoscope was inserted into the rectum repeatedly.  The prep was poor, and the endoscope had to be removed a number of times to clean off the end of the colonoscope, so visualization could be adequate.  Subsequently, the endoscope was passed under direct view to probably the ascending colon area almost near to the cecum.  However, the prep was fairly substandard at this point, and there were areas of solid stool that could clearly not be washed and suctioned.  Therefore, the exam was quite limited.  The endoscope was then slowly withdrawn taking circumferential views of the remaining visualized colon as best as possible.  This was done until we reached the rectum.  The rectum itself appeared too small to place the scope in retroflexion, so we did a view of the anal canal, and on pull-through, hemorrhoids were seen.  The endoscope was withdrawn.  The patients vital signs and pulse oximeter remained stable.  The patient tolerated the procedure well without apparent complications.  FINDINGS:  Substandard prep really limits the evaluation of this patients colon.  PLAN:  Re-prep her and maybe re-scope on Monday of next week.  Otherwise it was a negative examination. DD:  10/02/00 TD:  10/03/00 Job: [id number]

## 2011-04-12 LAB — MICROALBUMIN / CREATININE URINE RATIO
Microalb Creat Ratio: 199.2 mg/g — ABNORMAL HIGH (ref 0.0–30.0)
Microalb, Ur: 7.85 mg/dL — ABNORMAL HIGH (ref 0.00–1.89)

## 2011-04-13 LAB — URINE CULTURE
Colony Count: NO GROWTH
Organism ID, Bacteria: NO GROWTH

## 2011-04-22 ENCOUNTER — Other Ambulatory Visit: Payer: Self-pay | Admitting: Family Medicine

## 2011-04-22 NOTE — Telephone Encounter (Signed)
RX CALED TO PHARMACY

## 2011-05-13 ENCOUNTER — Other Ambulatory Visit: Payer: Self-pay | Admitting: Family Medicine

## 2011-05-19 ENCOUNTER — Ambulatory Visit (INDEPENDENT_AMBULATORY_CARE_PROVIDER_SITE_OTHER): Payer: Medicare Other | Admitting: Family Medicine

## 2011-05-19 ENCOUNTER — Encounter: Payer: Self-pay | Admitting: Family Medicine

## 2011-05-19 VITALS — BP 140/80 | HR 77 | Temp 97.9°F | Ht 64.0 in | Wt 234.1 lb

## 2011-05-19 DIAGNOSIS — N3941 Urge incontinence: Secondary | ICD-10-CM

## 2011-05-19 DIAGNOSIS — I10 Essential (primary) hypertension: Secondary | ICD-10-CM

## 2011-05-19 DIAGNOSIS — E119 Type 2 diabetes mellitus without complications: Secondary | ICD-10-CM

## 2011-05-19 LAB — POCT URINALYSIS DIPSTICK
Bilirubin, UA: NEGATIVE
Glucose, UA: NEGATIVE
Ketones, UA: NEGATIVE
Spec Grav, UA: 5
Urobilinogen, UA: NEGATIVE

## 2011-05-19 MED ORDER — FESOTERODINE FUMARATE ER 8 MG PO TB24: 8.0000 mg | ORAL_TABLET | Freq: Every day | ORAL | Status: DC

## 2011-05-19 NOTE — Assessment & Plan Note (Signed)
Worsened in last few months.  UA negative. Change enabelex to [place]

## 2011-05-19 NOTE — Progress Notes (Signed)
  Subjective:    Patient ID: Yvette Chavez, female    DOB: Mar 25, 1951, 60 y.o.   MRN: [id number]  HPI  Diabetes: At last OV... Poor control DM.. Lab Results  Component Value Date   HGBA1C 9.4* 04/08/2011  ; Started on victoza in addition to actos, Lantus  80 Unittis daily and Humalog rarely. Had initial Nausea, resolved now. Hypoglycemic episodes:67 once Hyperglycemic episodes:several 301, 335 etc. After meals. Feet problems:None Blood Sugars averaging: Checking twice a day...  FBS 81-122, 2hour PPD 198-246 No weight loss yet. Continues with poor diet.  Having trouble with food cost.  Hypertension:  Not checking At home.   Urinary incontinence: poor control on enabelex x 2 months. (previously well controlled) Has urinary urgency, unable to make it to toilet. Had surgery for stress incontinence in past.. Has mesh in place. No stress symptoms now. Tried vesicare in past not sure why stopped this. No abdominal pain, no flank pain.  No hematuria. No dysuria. Takes fluid pill.     Review of Systems  Constitutional: Negative for fever and fatigue.  HENT: Negative for ear pain.   Eyes: Negative for pain.  Respiratory: Negative for chest tightness and shortness of breath.   Cardiovascular: Negative for chest pain, palpitations and leg swelling.  Gastrointestinal: Negative for abdominal pain.  Genitourinary: Negative for dysuria.       Objective:   Physical Exam  Constitutional: Vital signs are normal. She appears well-developed and well-nourished. She is cooperative.  Non-toxic appearance. She does not appear ill. No distress.       obese  HENT:  Head: Normocephalic.  Right Ear: Hearing, tympanic membrane, external ear and ear canal normal. Tympanic membrane is not erythematous, not retracted and not bulging.  Left Ear: Hearing, tympanic membrane, external ear and ear canal normal. Tympanic membrane is not erythematous, not retracted and not bulging.  Nose: No mucosal  edema or rhinorrhea. Right sinus exhibits no maxillary sinus tenderness and no frontal sinus tenderness. Left sinus exhibits no maxillary sinus tenderness and no frontal sinus tenderness.       No dentures  Eyes: Conjunctivae, EOM and lids are normal. Pupils are equal, round, and reactive to light. No foreign bodies found.  Neck: Trachea normal and normal range of motion. Neck supple. Carotid bruit is not present. No mass and no thyromegaly present.  Cardiovascular: Normal rate, regular rhythm, S1 normal, S2 normal, normal heart sounds, intact distal pulses and normal pulses.  Exam reveals no gallop and no friction rub.   No murmur heard. Pulmonary/Chest: Effort normal and breath sounds normal. Not tachypneic. No respiratory distress. She has no decreased breath sounds. She has no wheezes. She has no rhonchi. She has no rales.  Abdominal: Soft. Normal appearance and bowel sounds are normal. There is no tenderness.  Neurological: She is alert.  Skin: Skin is warm, dry and intact. No rash noted.  Psychiatric: Her speech is normal and behavior is normal. Judgment and thought content normal. Her mood appears not anxious. Cognition and memory are normal. She does not exhibit a depressed mood.          Assessment & Plan:

## 2011-05-19 NOTE — Patient Instructions (Addendum)
Increase victoza to 1.8  daily.. The maximum notch on the pen. Check BP at home.. Call if BP >130/80.  Decrease Lantus down to 75 UNits at bedtime. Call if you feel blood sugars running to low. Do not adjust the Lantus on your own. Please call. Start Humalog 10 UNits every day at dinner. Work on healthy eating habits and low carb foods as able. Stop enabelex and start toviaz.  WE will call with urine culture results.

## 2011-05-19 NOTE — Assessment & Plan Note (Signed)
Poor control. Increase victoza. Decrease alntus as she is worried about lows and is slef adjusting at home all the way down to 40 Units.  She will call if she continues to have low CBS. Start SSI at dinner EVERY DAY.  Follow up for A1C in 2 months.  Encouraged exercise, weight loss, healthy eating habits. She has cost constraints on healthy food choices.

## 2011-05-21 ENCOUNTER — Telehealth: Payer: Self-pay | Admitting: *Deleted

## 2011-05-21 LAB — URINE CULTURE
Colony Count: NO GROWTH
Organism ID, Bacteria: NO GROWTH

## 2011-05-21 NOTE — Telephone Encounter (Signed)
Prior auth needed for Yvette Chavez, form is on your desk.

## 2011-05-26 ENCOUNTER — Other Ambulatory Visit: Payer: Self-pay | Admitting: *Deleted

## 2011-05-26 MED ORDER — OLMESARTAN MEDOXOMIL-HCTZ 40-25 MG PO TABS: 1.0000 | ORAL_TABLET | Freq: Every day | ORAL | Status: DC

## 2011-05-29 NOTE — Telephone Encounter (Signed)
Prior auth given for Yvette Chavez, advised pharmacy.  Approval letter placed on doctor's desk for signature and scanning.

## 2011-05-30 NOTE — Assessment & Plan Note (Signed)
Borderline control. Continue to follow.  MAy need med adjustment if continues to be borderline.

## 2011-06-13 ENCOUNTER — Telehealth: Payer: Self-pay | Admitting: *Deleted

## 2011-06-13 NOTE — Telephone Encounter (Signed)
8 mg is the highest dose she can be on.. Cannot increase to twice daily. There are several things we could have her try... 1. Can take it in AM to help more with daytime symptoms instead of nighttime as long as it does not make her sedated during the day. Or 2. She could try lower dose 4mg  but take these split dose in AM and PM. Or 3. We could try different medication.  The choice is hers.Marland Kitchen

## 2011-06-13 NOTE — Telephone Encounter (Signed)
Patient takes toviaz 8 mg tabs, she currently is taking them in the evening so that she doesn't have an accident during the night, but she says that it wears off early morning and then has a hard time getting to the bathroom the rest of the day. She is asking if she could take this bid. If so will need new script.  Please advise.

## 2011-06-16 MED ORDER — FESOTERODINE FUMARATE ER 4 MG PO TB24: 4.0000 mg | ORAL_TABLET | Freq: Two times a day (BID) | ORAL | Status: DC

## 2011-06-16 NOTE — Telephone Encounter (Signed)
Patient says that she would like to try the 4mg  in am and pm. Uses [facility] [address].

## 2011-06-26 ENCOUNTER — Other Ambulatory Visit: Payer: Self-pay | Admitting: *Deleted

## 2011-06-26 MED ORDER — LIRAGLUTIDE 18 MG/3ML ~~LOC~~ SOLN: 1.8000 mg | Freq: Every day | SUBCUTANEOUS | Status: DC

## 2011-06-26 NOTE — Telephone Encounter (Signed)
Will forward to Dr. Ermalene Searing

## 2011-06-26 NOTE — Telephone Encounter (Signed)
Pt needs refills on victoza sent to [facility] [address].  I dont see where this has been refilled before and I am not familiar with refilling this in regards to quantity and dosing.  She says it needs to be for 1.8.

## 2011-07-07 ENCOUNTER — Other Ambulatory Visit: Payer: Self-pay | Admitting: Family Medicine

## 2011-07-16 ENCOUNTER — Other Ambulatory Visit: Payer: Self-pay | Admitting: Family Medicine

## 2011-07-17 ENCOUNTER — Other Ambulatory Visit: Payer: Self-pay | Admitting: Family Medicine

## 2011-07-17 NOTE — Telephone Encounter (Signed)
rx faxed to [facility] since it was printed

## 2011-07-18 ENCOUNTER — Other Ambulatory Visit: Payer: Medicare Other

## 2011-07-21 ENCOUNTER — Other Ambulatory Visit: Payer: Self-pay | Admitting: Family Medicine

## 2011-07-25 ENCOUNTER — Encounter: Payer: Medicare Other | Admitting: Family Medicine

## 2011-07-29 ENCOUNTER — Other Ambulatory Visit: Payer: Self-pay | Admitting: Family Medicine

## 2011-08-18 ENCOUNTER — Other Ambulatory Visit: Payer: Self-pay | Admitting: Family Medicine

## 2011-08-18 ENCOUNTER — Other Ambulatory Visit: Payer: Medicare Other

## 2011-08-19 LAB — IRON AND TIBC
Iron: 17 — ABNORMAL LOW
Saturation Ratios: 6 — ABNORMAL LOW
TIBC: 298
UIBC: 281

## 2011-08-19 LAB — DIFFERENTIAL
Basophils Absolute: 0
Basophils Relative: 0
Eosinophils Absolute: 0.5
Eosinophils Relative: 0
Eosinophils Relative: 5
Lymphocytes Relative: 12
Lymphs Abs: 1.4
Monocytes Absolute: 0.7
Neutrophils Relative %: 80 — ABNORMAL HIGH

## 2011-08-19 LAB — CBC
HCT: 31.1 — ABNORMAL LOW
HCT: 31.2 — ABNORMAL LOW
Hemoglobin: 10.9 — ABNORMAL LOW
MCHC: 34.8
Platelets: 280
Platelets: 283
RDW: 15.4
WBC: 11.3 — ABNORMAL HIGH

## 2011-08-19 LAB — BASIC METABOLIC PANEL
BUN: 12
BUN: 20
CO2: 28
GFR calc Af Amer: >60
GFR calc non Af Amer: >60
Glucose, Bld: 64 — ABNORMAL LOW
Potassium: 3.8
Potassium: 3.8
Sodium: 142

## 2011-08-19 LAB — URINALYSIS, ROUTINE W REFLEX MICROSCOPIC
Nitrite: NEGATIVE
Specific Gravity, Urine: 1.036 — ABNORMAL HIGH
pH: 5.5

## 2011-08-19 LAB — LIPID PANEL
Cholesterol: 116
HDL: 32 — ABNORMAL LOW
Triglycerides: 85

## 2011-08-19 LAB — MAGNESIUM
Magnesium: 1.6
Magnesium: 1.9

## 2011-08-19 LAB — URINE CULTURE

## 2011-08-19 LAB — TSH: TSH: 2.16

## 2011-08-19 LAB — RETICULOCYTES: Retic Ct Pct: 1

## 2011-08-19 LAB — URINE MICROSCOPIC-ADD ON

## 2011-08-19 LAB — FERRITIN: Ferritin: 46 (ref 10–291)

## 2011-08-22 ENCOUNTER — Encounter: Payer: Medicare Other | Admitting: Family Medicine

## 2011-09-02 ENCOUNTER — Other Ambulatory Visit: Payer: Self-pay | Admitting: Family Medicine

## 2011-10-11 ENCOUNTER — Other Ambulatory Visit: Payer: Self-pay | Admitting: Family Medicine

## 2011-10-12 ENCOUNTER — Other Ambulatory Visit: Payer: Self-pay | Admitting: Family Medicine

## 2011-10-13 ENCOUNTER — Other Ambulatory Visit: Payer: Self-pay | Admitting: Family Medicine

## 2011-10-13 NOTE — Telephone Encounter (Signed)
Ok to refill #90  Filled in Dr. Daphine Deutscher absence while she is on vacation.

## 2011-11-15 ENCOUNTER — Other Ambulatory Visit: Payer: Self-pay | Admitting: Family Medicine

## 2011-12-08 ENCOUNTER — Other Ambulatory Visit: Payer: Self-pay | Admitting: Family Medicine

## 2011-12-15 ENCOUNTER — Other Ambulatory Visit: Payer: Self-pay | Admitting: Family Medicine

## 2012-01-08 ENCOUNTER — Other Ambulatory Visit: Payer: Self-pay | Admitting: Family Medicine

## 2012-01-23 ENCOUNTER — Other Ambulatory Visit: Payer: Self-pay | Admitting: Family Medicine

## 2012-01-28 ENCOUNTER — Other Ambulatory Visit: Payer: Self-pay | Admitting: Family Medicine

## 2012-01-28 NOTE — Telephone Encounter (Signed)
Rx called to pharmacy

## 2012-02-19 ENCOUNTER — Other Ambulatory Visit: Payer: Self-pay | Admitting: Family Medicine

## 2012-03-05 ENCOUNTER — Other Ambulatory Visit: Payer: Self-pay | Admitting: Family Medicine

## 2012-03-08 ENCOUNTER — Encounter: Payer: Self-pay | Admitting: Cardiothoracic Surgery

## 2012-03-08 ENCOUNTER — Encounter: Payer: Self-pay | Admitting: Nurse Practitioner

## 2012-03-24 ENCOUNTER — Encounter: Payer: Self-pay | Admitting: Nurse Practitioner

## 2012-03-24 ENCOUNTER — Encounter: Payer: Self-pay | Admitting: Cardiothoracic Surgery

## 2012-04-19 ENCOUNTER — Other Ambulatory Visit: Payer: Self-pay | Admitting: Family Medicine

## 2012-04-24 ENCOUNTER — Encounter: Payer: Self-pay | Admitting: Cardiothoracic Surgery

## 2012-04-24 ENCOUNTER — Other Ambulatory Visit: Payer: Self-pay | Admitting: Family Medicine

## 2012-04-24 ENCOUNTER — Encounter: Payer: Self-pay | Admitting: Nurse Practitioner

## 2012-04-27 NOTE — Telephone Encounter (Signed)
Yes, but I ca\hanges amounts to 1 month supply.. plesase let pharm know pt needs appt.

## 2012-04-27 NOTE — Telephone Encounter (Signed)
Patient number disconnected is it okay to refill and advise pharmacy no further refills until appt made

## 2012-04-27 NOTE — Telephone Encounter (Signed)
Pharmacy advised  

## 2012-04-27 NOTE — Telephone Encounter (Signed)
Refill once appt for CPX made, ahs not been seen since 04/2011

## 2012-04-30 ENCOUNTER — Other Ambulatory Visit: Payer: Self-pay | Admitting: Family Medicine

## 2012-04-30 NOTE — Telephone Encounter (Signed)
Appears last refill was 6/1. Refill denied.

## 2012-04-30 NOTE — Telephone Encounter (Signed)
Electronic refill request.  Please advise. 

## 2012-05-22 ENCOUNTER — Other Ambulatory Visit: Payer: Self-pay | Admitting: Family Medicine

## 2012-05-24 ENCOUNTER — Encounter: Payer: Self-pay | Admitting: Cardiothoracic Surgery

## 2012-05-24 ENCOUNTER — Encounter: Payer: Self-pay | Admitting: Nurse Practitioner

## 2012-05-27 ENCOUNTER — Other Ambulatory Visit: Payer: Self-pay | Admitting: Family Medicine

## 2012-06-06 ENCOUNTER — Other Ambulatory Visit: Payer: Self-pay | Admitting: Family Medicine

## 2012-06-10 ENCOUNTER — Ambulatory Visit (INDEPENDENT_AMBULATORY_CARE_PROVIDER_SITE_OTHER): Payer: Medicare Other | Admitting: Family Medicine

## 2012-06-10 ENCOUNTER — Encounter: Payer: Self-pay | Admitting: Family Medicine

## 2012-06-10 VITALS — BP 140/88 | HR 80 | Temp 99.0°F | Ht 64.0 in | Wt 191.2 lb

## 2012-06-10 DIAGNOSIS — E7800 Pure hypercholesterolemia, unspecified: Secondary | ICD-10-CM

## 2012-06-10 DIAGNOSIS — G909 Disorder of the autonomic nervous system, unspecified: Secondary | ICD-10-CM

## 2012-06-10 DIAGNOSIS — I10 Essential (primary) hypertension: Secondary | ICD-10-CM

## 2012-06-10 DIAGNOSIS — E1149 Type 2 diabetes mellitus with other diabetic neurological complication: Secondary | ICD-10-CM

## 2012-06-10 DIAGNOSIS — E119 Type 2 diabetes mellitus without complications: Secondary | ICD-10-CM

## 2012-06-10 DIAGNOSIS — E1142 Type 2 diabetes mellitus with diabetic polyneuropathy: Secondary | ICD-10-CM

## 2012-06-10 MED ORDER — ESZOPICLONE 3 MG PO TABS: ORAL_TABLET | ORAL | Status: DC

## 2012-06-10 MED ORDER — BUDESONIDE-FORMOTEROL FUMARATE 80-4.5 MCG/ACT IN AERO: INHALATION_SPRAY | RESPIRATORY_TRACT | Status: AC

## 2012-06-10 MED ORDER — TRAMADOL HCL 50 MG PO TABS: 50.0000 mg | ORAL_TABLET | Freq: Two times a day (BID) | ORAL | Status: DC | PRN

## 2012-06-10 MED ORDER — INSULIN LISPRO 100 UNIT/ML ~~LOC~~ SOLN: SUBCUTANEOUS | Status: AC

## 2012-06-10 MED ORDER — INSULIN GLARGINE 100 UNIT/ML ~~LOC~~ SOLN: SUBCUTANEOUS | Status: AC

## 2012-06-10 MED ORDER — NEBIVOLOL HCL 10 MG PO TABS: 10.0000 mg | ORAL_TABLET | Freq: Every day | ORAL | Status: AC

## 2012-06-10 MED ORDER — PANTOPRAZOLE SODIUM 40 MG PO TBEC: 40.0000 mg | DELAYED_RELEASE_TABLET | Freq: Every day | ORAL | Status: AC

## 2012-06-10 MED ORDER — RABEPRAZOLE SODIUM 20 MG PO TBEC: 20.0000 mg | DELAYED_RELEASE_TABLET | Freq: Every day | ORAL | Status: DC

## 2012-06-10 MED ORDER — GABAPENTIN 800 MG PO TABS: 800.0000 mg | ORAL_TABLET | Freq: Three times a day (TID) | ORAL | Status: DC

## 2012-06-10 MED ORDER — MONTELUKAST SODIUM 10 MG PO TABS: 10.0000 mg | ORAL_TABLET | Freq: Every day | ORAL | Status: AC

## 2012-06-10 MED ORDER — MOMETASONE FUROATE 50 MCG/ACT NA SUSP: 2.0000 | Freq: Every day | NASAL | Status: AC

## 2012-06-10 MED ORDER — FESOTERODINE FUMARATE ER 4 MG PO TB24: 4.0000 mg | ORAL_TABLET | Freq: Two times a day (BID) | ORAL | Status: DC

## 2012-06-10 MED ORDER — ALBUTEROL SULFATE HFA 108 (90 BASE) MCG/ACT IN AERS: 2.0000 | INHALATION_SPRAY | RESPIRATORY_TRACT | Status: AC | PRN

## 2012-06-10 MED ORDER — SERTRALINE HCL 100 MG PO TABS: ORAL_TABLET | ORAL | Status: AC

## 2012-06-10 MED ORDER — GLUCOSE BLOOD VI DISK: DISK | Status: AC

## 2012-06-10 MED ORDER — INSULIN PEN NEEDLE 31G X 8 MM MISC: Status: AC

## 2012-06-10 MED ORDER — LIRAGLUTIDE 18 MG/3ML ~~LOC~~ SOLN: SUBCUTANEOUS | Status: AC

## 2012-06-10 MED ORDER — OLMESARTAN MEDOXOMIL-HCTZ 40-25 MG PO TABS: 1.0000 | ORAL_TABLET | Freq: Every day | ORAL | Status: AC

## 2012-06-10 MED ORDER — POTASSIUM CHLORIDE CRYS ER 10 MEQ PO TBCR: 10.0000 meq | EXTENDED_RELEASE_TABLET | Freq: Two times a day (BID) | ORAL | Status: AC

## 2012-06-10 MED ORDER — TORSEMIDE 20 MG PO TABS: ORAL_TABLET | ORAL | Status: DC

## 2012-06-10 MED ORDER — FERROUS SULFATE 325 (65 FE) MG PO TBEC: 325.0000 mg | DELAYED_RELEASE_TABLET | Freq: Every day | ORAL | Status: AC

## 2012-06-10 NOTE — Patient Instructions (Addendum)
Increase bystolic to 10 mg daily to improve blood pressure. **Return for fasting labs in next few days.** Trial of tramadol for pain in back .Marland Kitchen Use in a limited fashion.  Can try to add zyrtec at bedtime for allergies.

## 2012-06-10 NOTE — Assessment & Plan Note (Signed)
Due for re-eval.. Yvette Chavez be better given weight loss.

## 2012-06-10 NOTE — Progress Notes (Signed)
Subjective:    Patient ID: Yvette Chavez, female    DOB: [birth year], 61 y.o.   MRN: [id number]  HPI 61 year old female presents for follow up.Marland Kitchen Has not been seen in 1 year.  Diabetes:  Over due for re-check. Lab Results  Component Value Date   HGBA1C 9.4* 04/08/2011  Started on victoza in addition to, Lantus 80 Units daily and Humalog rarely.  Had initial Nausea, resolved now.  Hypoglycemic episodes? Hyperglycemic episodes:?Feet problems:None  Blood Sugars averaging:not checking because out of battery.  Has lost 50 lbs in last year!!!! Eating better.  Exercise with walking daily. Having trouble with food cost.  Foot issues: out of gabapentin so bunring is worse.. Will refill.  Wound on left foot.. Seen at wound care center  Hypertension:  Inadequate control on benicar HCTZ 40/25 and bistolic 5 mg  Using medication without problems or lightheadedness:  none Chest pain with exertion:None Edema:None Short of breath:stable Average home BPs: Not checking.  Other issues:  Elevated Cholesterol:  Due for re-eval. On crestor 20 mg. Lab Results  Component Value Date   CHOL 133 04/08/2011   HDL 42.20 04/08/2011   LDLCALC 66 04/08/2011   TRIG 125.0 04/08/2011   CHOLHDL 3 04/08/2011    Using medications without problems: Muscle aches:  Diet compliance: Exercise: Other complaints:   Chronic back pain, neck pain: Cannot afford pain center.  Has not been on opana in a long while. Had missed a few appointments as well. Has never tried tramadol but is interested in any med. Review of Systems  Constitutional: Negative for fever and fatigue.  HENT: Negative for congestion.   Eyes: Negative for pain.  Respiratory: Negative for cough.   Cardiovascular: Negative for chest pain.  Gastrointestinal: Negative for abdominal pain.  Genitourinary: Negative for dysuria and hematuria.  Musculoskeletal:       Neck pain       Objective:   Physical Exam  Constitutional: Vital signs are normal.  She appears well-developed and well-nourished. She is cooperative.  Non-toxic appearance. She does not appear ill. No distress.       obese  HENT:  Head: Normocephalic.  Right Ear: Hearing, tympanic membrane, external ear and ear canal normal. Tympanic membrane is not erythematous, not retracted and not bulging.  Left Ear: Hearing, tympanic membrane, external ear and ear canal normal. Tympanic membrane is not erythematous, not retracted and not bulging.  Nose: No mucosal edema or rhinorrhea. Right sinus exhibits no maxillary sinus tenderness and no frontal sinus tenderness. Left sinus exhibits no maxillary sinus tenderness and no frontal sinus tenderness.       No dentures/teeth  Eyes: Conjunctivae, EOM and lids are normal. Pupils are equal, round, and reactive to light. No foreign bodies found.  Neck: Trachea normal and normal range of motion. Neck supple. Carotid bruit is not present. No mass and no thyromegaly present.  Cardiovascular: Normal rate, regular rhythm, S1 normal, S2 normal, normal heart sounds, intact distal pulses and normal pulses.  Exam reveals no gallop and no friction rub.   No murmur heard. Pulmonary/Chest: Effort normal and breath sounds normal. Not tachypneic. No respiratory distress. She has no decreased breath sounds. She has no wheezes. She has no rhonchi. She has no rales.  Abdominal: Soft. Normal appearance and bowel sounds are normal. There is no tenderness.  Neurological: She is alert.  Skin: Skin is warm, dry and intact. No rash noted.  Psychiatric: Her speech is normal and behavior is normal. Judgment and  thought content normal. Her mood appears not anxious. Cognition and memory are normal. She does not exhibit a depressed mood.     Diabetic foot exam: Ulcer/wound on  Left foot... Sen at wound care center weekly. Now dime size. Has  calluses  Normal DP pulses No sensation to light touch and monofilament Nails thickened      Assessment & Plan:

## 2012-06-10 NOTE — Assessment & Plan Note (Signed)
Restart gabapentin.

## 2012-06-10 NOTE — Assessment & Plan Note (Signed)
?   Control. Due for re-eval. Encouraged exercise, weight loss, healthy eating habits.  

## 2012-06-10 NOTE — Assessment & Plan Note (Signed)
Not at goal, increase bystolic to 10 mg daily and continue olmesartan/HCTZ.

## 2012-06-14 ENCOUNTER — Other Ambulatory Visit: Payer: Medicare Other

## 2012-06-15 ENCOUNTER — Telehealth: Payer: Self-pay

## 2012-06-15 NOTE — Telephone Encounter (Signed)
Prior auth needed for Aciphex. PA form is in Dr Daphine Deutscher in box.

## 2012-06-15 NOTE — Telephone Encounter (Signed)
Completed.

## 2012-06-17 ENCOUNTER — Other Ambulatory Visit: Payer: Medicare Other

## 2012-06-18 ENCOUNTER — Ambulatory Visit: Payer: Self-pay | Admitting: Nurse Practitioner

## 2012-06-24 ENCOUNTER — Encounter: Payer: Self-pay | Admitting: Nurse Practitioner

## 2012-06-24 ENCOUNTER — Encounter: Payer: Self-pay | Admitting: Cardiothoracic Surgery

## 2012-06-29 NOTE — Telephone Encounter (Signed)
Prior auth given for Aciphex;letter placed on Dr Daphine Deutscher desk for signature and scanning.

## 2012-07-05 LAB — WOUND CULTURE

## 2012-07-12 ENCOUNTER — Other Ambulatory Visit: Payer: Self-pay | Admitting: Family Medicine

## 2012-07-15 ENCOUNTER — Other Ambulatory Visit: Payer: Self-pay | Admitting: Family Medicine

## 2012-07-15 NOTE — Telephone Encounter (Signed)
Electronic refill request

## 2012-07-16 ENCOUNTER — Other Ambulatory Visit (INDEPENDENT_AMBULATORY_CARE_PROVIDER_SITE_OTHER): Payer: Medicare Other

## 2012-07-16 DIAGNOSIS — E7800 Pure hypercholesterolemia, unspecified: Secondary | ICD-10-CM

## 2012-07-16 DIAGNOSIS — E1149 Type 2 diabetes mellitus with other diabetic neurological complication: Secondary | ICD-10-CM

## 2012-07-16 DIAGNOSIS — E119 Type 2 diabetes mellitus without complications: Secondary | ICD-10-CM

## 2012-07-16 LAB — COMPREHENSIVE METABOLIC PANEL
CO2: 30 mEq/L (ref 19–32)
Creatinine, Ser: 0.8 mg/dL (ref 0.4–1.2)
GFR: 73.6 mL/min (ref >60.00)
Glucose, Bld: 60 mg/dL — ABNORMAL LOW (ref 70–99)
Total Bilirubin: 0.5 mg/dL (ref 0.3–1.2)

## 2012-07-16 LAB — LIPID PANEL
HDL: 41.4 mg/dL (ref >39.00)
Triglycerides: 104 mg/dL (ref 0.0–149.0)

## 2012-07-16 LAB — HEMOGLOBIN A1C: Hgb A1c MFr Bld: 8.1 % — ABNORMAL HIGH (ref 4.6–6.5)

## 2012-07-25 ENCOUNTER — Encounter: Payer: Self-pay | Admitting: Cardiothoracic Surgery

## 2012-07-25 ENCOUNTER — Encounter: Payer: Self-pay | Admitting: Nurse Practitioner

## 2012-08-04 ENCOUNTER — Other Ambulatory Visit: Payer: Self-pay | Admitting: Family Medicine

## 2012-08-05 NOTE — Telephone Encounter (Signed)
Medication phoned to pharmacy.  

## 2012-08-10 ENCOUNTER — Telehealth: Payer: Self-pay | Admitting: *Deleted

## 2012-08-10 ENCOUNTER — Other Ambulatory Visit: Payer: Self-pay | Admitting: Family Medicine

## 2012-08-10 NOTE — Telephone Encounter (Signed)
Given fasting lo., I hesitate to increase meds without home fasting CBGs and 2 hour postprandials.. Has she gotten her meter up and running .Marland Kitchen If not do and call in 1 week wth CBGs,if so what are fasting and postprandials?

## 2012-08-10 NOTE — Telephone Encounter (Signed)
Advised patient of lab results from last month.  She says her phone has been off and she is just now able to call back.  Advised her to increase her potassium to 3 a day.  She says she is taking lantus 80 units a day, victoza 1.8 units a day and humulog 10 units a day.  You had said in result note that her meds would need to be adjusted.  Please advise.  ( best time to call patient back is around 5 pm)

## 2012-08-13 NOTE — Telephone Encounter (Signed)
Left message on patient vm as instructed.

## 2012-08-17 ENCOUNTER — Other Ambulatory Visit: Payer: Self-pay | Admitting: Family Medicine

## 2012-08-18 NOTE — Telephone Encounter (Signed)
Called patient.  She says her meter isn't up and running yet because she needs to get a battery for it, which she said she would do.  I asked her to go ahead and do that if she can and call us back in about a week with fasting and 2 hour after a meal readings.  She said she would do that.

## 2012-08-18 NOTE — Telephone Encounter (Signed)
Noted  

## 2012-08-19 ENCOUNTER — Other Ambulatory Visit: Payer: Self-pay | Admitting: Family Medicine

## 2012-08-19 ENCOUNTER — Other Ambulatory Visit: Payer: Self-pay

## 2012-08-19 NOTE — Telephone Encounter (Signed)
Request for Tramadol 50 mg. Ok to refill?

## 2012-08-24 ENCOUNTER — Encounter: Payer: Self-pay | Admitting: Cardiothoracic Surgery

## 2012-08-24 ENCOUNTER — Encounter: Payer: Self-pay | Admitting: Nurse Practitioner

## 2012-09-07 ENCOUNTER — Other Ambulatory Visit (INDEPENDENT_AMBULATORY_CARE_PROVIDER_SITE_OTHER): Payer: Medicare Other

## 2012-09-07 ENCOUNTER — Telehealth: Payer: Self-pay | Admitting: Family Medicine

## 2012-09-07 DIAGNOSIS — D509 Iron deficiency anemia, unspecified: Secondary | ICD-10-CM

## 2012-09-07 DIAGNOSIS — E1142 Type 2 diabetes mellitus with diabetic polyneuropathy: Secondary | ICD-10-CM

## 2012-09-07 DIAGNOSIS — E7800 Pure hypercholesterolemia, unspecified: Secondary | ICD-10-CM

## 2012-09-07 DIAGNOSIS — E119 Type 2 diabetes mellitus without complications: Secondary | ICD-10-CM

## 2012-09-07 DIAGNOSIS — E1149 Type 2 diabetes mellitus with other diabetic neurological complication: Secondary | ICD-10-CM

## 2012-09-07 LAB — CBC WITH DIFFERENTIAL/PLATELET
Eosinophils Absolute: 0 10*3/uL (ref 0.0–0.7)
Eosinophils Relative: 0 % (ref 0.0–5.0)
HCT: 31.4 % — ABNORMAL LOW (ref 36.0–46.0)
Lymphs Abs: 2.3 10*3/uL (ref 0.7–4.0)
MCHC: 31.3 g/dL (ref 30.0–36.0)
MCV: 77.5 fl — ABNORMAL LOW (ref 78.0–100.0)
Monocytes Absolute: 0.6 10*3/uL (ref 0.1–1.0)
Platelets: 259 10*3/uL (ref 150.0–400.0)
RDW: 15.6 % — ABNORMAL HIGH (ref 11.5–14.6)
WBC: 8.3 10*3/uL (ref 4.5–10.5)

## 2012-09-07 LAB — COMPREHENSIVE METABOLIC PANEL
AST: 54 U/L — ABNORMAL HIGH (ref 0–37)
Alkaline Phosphatase: 103 U/L (ref 39–117)
BUN: 17 mg/dL (ref 6–23)
Glucose, Bld: 112 mg/dL — ABNORMAL HIGH (ref 70–99)
Sodium: 137 mEq/L (ref 135–145)
Total Bilirubin: 0.6 mg/dL (ref 0.3–1.2)
Total Protein: 8 g/dL (ref 6.0–8.3)

## 2012-09-07 NOTE — Telephone Encounter (Signed)
Message copied by Excell Seltzer on Tue Sep 07, 2012 12:14 AM ------      Message from: Alvina Chou      Created: Wed Sep 01, 2012  3:36 PM      Regarding: lab orders for Tuesday, 10.15.13       Patient is scheduled for CPX labs, please order future labs, Thanks , Camelia Eng

## 2012-09-14 ENCOUNTER — Encounter: Payer: Medicare Other | Admitting: Family Medicine

## 2012-09-14 DIAGNOSIS — Z0289 Encounter for other administrative examinations: Secondary | ICD-10-CM

## 2012-09-15 ENCOUNTER — Other Ambulatory Visit: Payer: Self-pay | Admitting: Family Medicine

## 2012-09-15 NOTE — Telephone Encounter (Signed)
Received refill request electronically. Last office visit 06/10/12. Is it okay to refill medication?

## 2012-09-17 NOTE — Telephone Encounter (Signed)
Notify pt that she can take toviaz 8 mg at once instead of 4 mg twice a day if she would prefer. Should last 24 hours. Refill as request for 1 month with 5 RF.

## 2012-09-20 ENCOUNTER — Other Ambulatory Visit: Payer: Self-pay | Admitting: Family Medicine

## 2012-09-20 NOTE — Telephone Encounter (Signed)
Left message on machine to call back  

## 2012-09-20 NOTE — Telephone Encounter (Signed)
Received refill request electronically. Last office visit 06/10/12. Is it okay to refill?

## 2012-09-22 NOTE — Telephone Encounter (Signed)
Patient called and left a message on voicemail that she is not at her phone number and will have to call back with the telephone number to reach her at.

## 2012-10-19 ENCOUNTER — Other Ambulatory Visit: Payer: Self-pay | Admitting: Family Medicine

## 2012-10-20 ENCOUNTER — Other Ambulatory Visit: Payer: Self-pay | Admitting: Family Medicine

## 2012-10-22 ENCOUNTER — Encounter: Payer: Medicare Other | Admitting: Family Medicine

## 2012-10-24 ENCOUNTER — Ambulatory Visit: Payer: Self-pay | Admitting: Internal Medicine

## 2012-10-24 ENCOUNTER — Other Ambulatory Visit: Payer: Self-pay | Admitting: Family Medicine

## 2012-11-10 ENCOUNTER — Inpatient Hospital Stay: Payer: Self-pay | Admitting: Internal Medicine

## 2012-11-10 LAB — URINALYSIS, COMPLETE
Bilirubin,UR: NEGATIVE
Glucose,UR: >=500 mg/dL (ref 0–75)
Ketone: NEGATIVE
Leukocyte Esterase: NEGATIVE
Protein: 100
RBC,UR: 1 /HPF (ref 0–5)
WBC UR: NONE SEEN /HPF (ref 0–5)

## 2012-11-10 LAB — DRUG SCREEN, URINE
Amphetamines, Ur Screen: NEGATIVE (ref ?–1000)
Cannabinoid 50 Ng, Ur ~~LOC~~: NEGATIVE (ref ?–50)
Cocaine Metabolite,Ur ~~LOC~~: NEGATIVE (ref ?–300)
MDMA (Ecstasy)Ur Screen: NEGATIVE (ref ?–500)
Opiate, Ur Screen: NEGATIVE (ref ?–300)
Phencyclidine (PCP) Ur S: NEGATIVE (ref ?–25)

## 2012-11-10 LAB — COMPREHENSIVE METABOLIC PANEL
Albumin: 3.9 g/dL (ref 3.4–5.0)
Anion Gap: 8 (ref 7–16)
Calcium, Total: 9.9 mg/dL (ref 8.5–10.1)
Chloride: 99 mmol/L (ref 98–107)
EGFR (African American): 50 — ABNORMAL LOW
Glucose: 648 mg/dL (ref 65–99)
Osmolality: 301 (ref 275–301)
Potassium: 4.2 mmol/L (ref 3.5–5.1)
SGOT(AST): 88 U/L — ABNORMAL HIGH (ref 15–37)
Sodium: 134 mmol/L — ABNORMAL LOW (ref 136–145)
Total Protein: 9.2 g/dL — ABNORMAL HIGH (ref 6.4–8.2)

## 2012-11-10 LAB — CSF CELL COUNT WITH DIFFERENTIAL
CSF Tube #: 3
Lymphocytes: 0 %
Monocytes/Macrophages: 0 %
Monocytes/Macrophages: 0 %
Neutrophils: 0 %
Other Cells: 0 %
Other Cells: 0 %
RBC (CSF): 19 /mm3
RBC (CSF): 2 /mm3
WBC (CSF): 0 /mm3
WBC (CSF): 0 /mm3

## 2012-11-10 LAB — APTT: Activated PTT: 28.6 secs (ref 23.6–35.9)

## 2012-11-10 LAB — PHOSPHORUS: Phosphorus: 7.2 mg/dL — ABNORMAL HIGH (ref 2.5–4.9)

## 2012-11-10 LAB — CK TOTAL AND CKMB (NOT AT ARMC)
CK, Total: 58 U/L (ref 21–215)
CK-MB: 1.3 ng/mL (ref 0.5–3.6)

## 2012-11-10 LAB — PROTEIN, CSF: Protein, CSF: 79 mg/dL — ABNORMAL HIGH (ref 15–45)

## 2012-11-10 LAB — MAGNESIUM: Magnesium: 1.8 mg/dL

## 2012-11-10 LAB — TROPONIN I: Troponin-I: 0.04 ng/mL

## 2012-11-10 LAB — CBC
HCT: 39.1 % (ref 35.0–47.0)
HGB: 12.1 g/dL (ref 12.0–16.0)
MCH: 24.8 pg — ABNORMAL LOW (ref 26.0–34.0)
MCHC: 30.9 g/dL — ABNORMAL LOW (ref 32.0–36.0)
RBC: 4.88 10*6/uL (ref 3.80–5.20)

## 2012-11-10 LAB — GLUCOSE, CSF: Glucose, CSF: 296 mg/dL — ABNORMAL HIGH (ref 40–75)

## 2012-11-10 LAB — ACETAMINOPHEN LEVEL: Acetaminophen: <2 ug/mL

## 2012-11-11 DIAGNOSIS — R7989 Other specified abnormal findings of blood chemistry: Secondary | ICD-10-CM

## 2012-11-11 DIAGNOSIS — J9600 Acute respiratory failure, unspecified whether with hypoxia or hypercapnia: Secondary | ICD-10-CM

## 2012-11-11 DIAGNOSIS — I4891 Unspecified atrial fibrillation: Secondary | ICD-10-CM

## 2012-11-11 LAB — LIPID PANEL
Cholesterol: 133 mg/dL (ref 0–200)
Ldl Cholesterol, Calc: 64 mg/dL (ref 0–100)
Triglycerides: 109 mg/dL (ref 0–200)
VLDL Cholesterol, Calc: 22 mg/dL (ref 5–40)

## 2012-11-11 LAB — BASIC METABOLIC PANEL
Anion Gap: 11 (ref 7–16)
Calcium, Total: 9.6 mg/dL (ref 8.5–10.1)
Chloride: 104 mmol/L (ref 98–107)
Co2: 23 mmol/L (ref 21–32)
Creatinine: 1.41 mg/dL — ABNORMAL HIGH (ref 0.60–1.30)
EGFR (African American): 47 — ABNORMAL LOW
EGFR (Non-African Amer.): 41 — ABNORMAL LOW
Glucose: 465 mg/dL — ABNORMAL HIGH (ref 65–99)
Osmolality: 300 (ref 275–301)
Potassium: 3.1 mmol/L — ABNORMAL LOW (ref 3.5–5.1)

## 2012-11-11 LAB — CBC WITH DIFFERENTIAL/PLATELET
Eosinophil %: 0 %
Lymphocyte #: 0.7 10*3/uL — ABNORMAL LOW (ref 1.0–3.6)
MCV: 76 fL — ABNORMAL LOW (ref 80–100)
Monocyte %: 2.8 %
Neutrophil #: 17.4 10*3/uL — ABNORMAL HIGH (ref 1.4–6.5)
Neutrophil %: 93.5 %

## 2012-11-11 LAB — RAPID INFLUENZA A&B ANTIGENS

## 2012-11-11 LAB — POTASSIUM: Potassium: 4 mmol/L (ref 3.5–5.1)

## 2012-11-11 LAB — HEMOGLOBIN A1C: Hemoglobin A1C: 10.4 % — ABNORMAL HIGH (ref 4.2–6.3)

## 2012-11-11 LAB — TROPONIN I: Troponin-I: 1.32 ng/mL — ABNORMAL HIGH

## 2012-11-11 LAB — MAGNESIUM
Magnesium: 1.6 mg/dL — ABNORMAL LOW
Magnesium: 2.2 mg/dL

## 2012-11-11 LAB — GLUCOSE, RANDOM: Glucose: 629 mg/dL (ref 65–99)

## 2012-11-12 DIAGNOSIS — I4891 Unspecified atrial fibrillation: Secondary | ICD-10-CM

## 2012-11-12 DIAGNOSIS — I517 Cardiomegaly: Secondary | ICD-10-CM

## 2012-11-12 LAB — CBC WITH DIFFERENTIAL/PLATELET
Basophil #: 0.1 10*3/uL (ref 0.0–0.1)
Basophil %: 0.4 %
Eosinophil #: 0 10*3/uL (ref 0.0–0.7)
HGB: 11.2 g/dL — ABNORMAL LOW (ref 12.0–16.0)
MCV: 76 fL — ABNORMAL LOW (ref 80–100)
Monocyte #: 1 x10 3/mm — ABNORMAL HIGH (ref 0.2–0.9)
Monocyte %: 4.8 %
Neutrophil #: 18.1 10*3/uL — ABNORMAL HIGH (ref 1.4–6.5)
Neutrophil %: 88.2 %
Platelet: 253 10*3/uL (ref 150–440)
RBC: 4.75 10*6/uL (ref 3.80–5.20)
WBC: 20.5 10*3/uL — ABNORMAL HIGH (ref 3.6–11.0)

## 2012-11-12 LAB — BASIC METABOLIC PANEL
Calcium, Total: 9.5 mg/dL (ref 8.5–10.1)
Chloride: 112 mmol/L — ABNORMAL HIGH (ref 98–107)
Co2: 24 mmol/L (ref 21–32)
Creatinine: 1.07 mg/dL (ref 0.60–1.30)
EGFR (Non-African Amer.): 57 — ABNORMAL LOW
Osmolality: 293 (ref 275–301)
Potassium: 3.8 mmol/L (ref 3.5–5.1)
Sodium: 144 mmol/L (ref 136–145)

## 2012-11-13 ENCOUNTER — Ambulatory Visit: Payer: Self-pay | Admitting: Neurology

## 2012-11-13 LAB — BASIC METABOLIC PANEL
Anion Gap: 9 (ref 7–16)
BUN: 22 mg/dL — ABNORMAL HIGH (ref 7–18)
Calcium, Total: 8.5 mg/dL (ref 8.5–10.1)
Chloride: 110 mmol/L — ABNORMAL HIGH (ref 98–107)
Co2: 23 mmol/L (ref 21–32)
Osmolality: 291 (ref 275–301)
Potassium: 3.7 mmol/L (ref 3.5–5.1)
Sodium: 142 mmol/L (ref 136–145)

## 2012-11-13 LAB — CBC WITH DIFFERENTIAL/PLATELET
Basophil #: 0.1 10*3/uL (ref 0.0–0.1)
Eosinophil #: 0 10*3/uL (ref 0.0–0.7)
Eosinophil %: 0.3 %
HCT: 32.6 % — ABNORMAL LOW (ref 35.0–47.0)
Lymphocyte %: 10.5 %
MCV: 77 fL — ABNORMAL LOW (ref 80–100)
Monocyte #: 0.9 x10 3/mm (ref 0.2–0.9)
Neutrophil %: 82.4 %
RBC: 4.27 10*6/uL (ref 3.80–5.20)
RDW: 17.4 % — ABNORMAL HIGH (ref 11.5–14.5)
WBC: 14.1 10*3/uL — ABNORMAL HIGH (ref 3.6–11.0)

## 2012-11-13 LAB — PHOSPHORUS: Phosphorus: 2.6 mg/dL (ref 2.5–4.9)

## 2012-11-14 LAB — BASIC METABOLIC PANEL
BUN: 21 mg/dL — ABNORMAL HIGH (ref 7–18)
Co2: 22 mmol/L (ref 21–32)
Creatinine: 0.83 mg/dL (ref 0.60–1.30)
EGFR (African American): >60
EGFR (Non-African Amer.): >60
Glucose: 185 mg/dL — ABNORMAL HIGH (ref 65–99)
Sodium: 143 mmol/L (ref 136–145)

## 2012-11-14 LAB — TSH: Thyroid Stimulating Horm: 0.728 u[IU]/mL

## 2012-11-14 LAB — CBC WITH DIFFERENTIAL/PLATELET
Basophil #: 0 10*3/uL (ref 0.0–0.1)
Eosinophil #: 0.1 10*3/uL (ref 0.0–0.7)
HCT: 31.4 % — ABNORMAL LOW (ref 35.0–47.0)
Lymphocyte #: 1.6 10*3/uL (ref 1.0–3.6)
Lymphocyte %: 13.7 %
MCH: 24.3 pg — ABNORMAL LOW (ref 26.0–34.0)
MCHC: 31.6 g/dL — ABNORMAL LOW (ref 32.0–36.0)
Neutrophil #: 9.3 10*3/uL — ABNORMAL HIGH (ref 1.4–6.5)
Neutrophil %: 78.6 %
RDW: 17.6 % — ABNORMAL HIGH (ref 11.5–14.5)

## 2012-11-14 LAB — HEPATIC FUNCTION PANEL A (ARMC)
Albumin: 2.5 g/dL — ABNORMAL LOW (ref 3.4–5.0)
Alkaline Phosphatase: 150 U/L — ABNORMAL HIGH (ref 50–136)
Bilirubin, Direct: 0.1 mg/dL (ref 0.00–0.20)
Bilirubin,Total: 0.3 mg/dL (ref 0.2–1.0)
SGOT(AST): 208 U/L — ABNORMAL HIGH (ref 15–37)
Total Protein: 6.2 g/dL — ABNORMAL LOW (ref 6.4–8.2)

## 2012-11-14 LAB — AMMONIA: Ammonia, Plasma: 54 mcmol/L — ABNORMAL HIGH (ref 11–32)

## 2012-11-15 ENCOUNTER — Other Ambulatory Visit: Payer: Self-pay | Admitting: Family Medicine

## 2012-11-15 LAB — COMPREHENSIVE METABOLIC PANEL
Albumin: 2.4 g/dL — ABNORMAL LOW (ref 3.4–5.0)
Alkaline Phosphatase: 160 U/L — ABNORMAL HIGH (ref 50–136)
Anion Gap: 8 (ref 7–16)
BUN: 16 mg/dL (ref 7–18)
Chloride: 114 mmol/L — ABNORMAL HIGH (ref 98–107)
Creatinine: 0.79 mg/dL (ref 0.60–1.30)
Glucose: 149 mg/dL — ABNORMAL HIGH (ref 65–99)
Potassium: 3.4 mmol/L — ABNORMAL LOW (ref 3.5–5.1)
SGOT(AST): 103 U/L — ABNORMAL HIGH (ref 15–37)
Total Protein: 6.3 g/dL — ABNORMAL LOW (ref 6.4–8.2)

## 2012-11-15 LAB — AMMONIA: Ammonia, Plasma: 29 mcmol/L (ref 11–32)

## 2012-11-16 LAB — CBC WITH DIFFERENTIAL/PLATELET
Basophil #: 0.1 10*3/uL (ref 0.0–0.1)
Eosinophil #: 0.3 10*3/uL (ref 0.0–0.7)
HCT: 29.2 % — ABNORMAL LOW (ref 35.0–47.0)
Lymphocyte #: 1.4 10*3/uL (ref 1.0–3.6)
MCH: 23.2 pg — ABNORMAL LOW (ref 26.0–34.0)
MCHC: 30.1 g/dL — ABNORMAL LOW (ref 32.0–36.0)
MCV: 77 fL — ABNORMAL LOW (ref 80–100)
Monocyte #: 1.1 x10 3/mm — ABNORMAL HIGH (ref 0.2–0.9)
Neutrophil #: 11 10*3/uL — ABNORMAL HIGH (ref 1.4–6.5)
RDW: 17.2 % — ABNORMAL HIGH (ref 11.5–14.5)

## 2012-11-16 LAB — CULTURE, BLOOD (SINGLE)

## 2012-11-16 LAB — BASIC METABOLIC PANEL
Anion Gap: 9 (ref 7–16)
Calcium, Total: 8.5 mg/dL (ref 8.5–10.1)
Co2: 22 mmol/L (ref 21–32)
Creatinine: 0.78 mg/dL (ref 0.60–1.30)
EGFR (African American): >60
EGFR (Non-African Amer.): >60
Sodium: 143 mmol/L (ref 136–145)

## 2012-11-16 LAB — PHOSPHORUS: Phosphorus: 3.2 mg/dL (ref 2.5–4.9)

## 2012-11-16 LAB — CLOSTRIDIUM DIFFICILE BY PCR

## 2012-11-18 LAB — BASIC METABOLIC PANEL
Anion Gap: 9 (ref 7–16)
BUN: 24 mg/dL — ABNORMAL HIGH (ref 7–18)
Co2: 20 mmol/L — ABNORMAL LOW (ref 21–32)
Creatinine: 0.75 mg/dL (ref 0.60–1.30)
EGFR (Non-African Amer.): >60
Glucose: 197 mg/dL — ABNORMAL HIGH (ref 65–99)
Osmolality: 289 (ref 275–301)
Potassium: 4.4 mmol/L (ref 3.5–5.1)
Sodium: 140 mmol/L (ref 136–145)

## 2012-11-18 LAB — CBC WITH DIFFERENTIAL/PLATELET
Basophil #: 0.1 10*3/uL (ref 0.0–0.1)
Eosinophil #: 0 10*3/uL (ref 0.0–0.7)
HCT: 28.3 % — ABNORMAL LOW (ref 35.0–47.0)
HGB: 8.8 g/dL — ABNORMAL LOW (ref 12.0–16.0)
Lymphocyte %: 4.6 %
MCH: 23.9 pg — ABNORMAL LOW (ref 26.0–34.0)
MCHC: 31.2 g/dL — ABNORMAL LOW (ref 32.0–36.0)
Monocyte #: 0.5 x10 3/mm (ref 0.2–0.9)
Neutrophil #: 11.8 10*3/uL — ABNORMAL HIGH (ref 1.4–6.5)
Neutrophil %: 90.8 %
Platelet: 281 10*3/uL (ref 150–440)
RDW: 17.3 % — ABNORMAL HIGH (ref 11.5–14.5)

## 2012-11-24 ENCOUNTER — Ambulatory Visit: Payer: Self-pay | Admitting: Internal Medicine

## 2012-11-25 ENCOUNTER — Other Ambulatory Visit: Payer: Self-pay | Admitting: *Deleted

## 2012-11-25 MED ORDER — TORSEMIDE 20 MG PO TABS: ORAL_TABLET | ORAL | Status: AC

## 2012-12-01 ENCOUNTER — Other Ambulatory Visit: Payer: Self-pay | Admitting: *Deleted

## 2012-12-01 MED ORDER — FESOTERODINE FUMARATE ER 4 MG PO TB24: 4.0000 mg | ORAL_TABLET | Freq: Every day | ORAL | Status: AC

## 2012-12-25 DEATH — deceased

## 2013-06-13 IMAGING — CT CT HEAD WITHOUT CONTRAST
2 of 3 series · 16 of 30 positions shown, 18 images · non-contrast
Comparison: none

REASON FOR EXAM: altered mental status
COMMENTS:

PROCEDURE:     CT  - CT HEAD WITHOUT CONTRAST  - November 13, 2012 [DATE]
RESULT:     Comparison:  11/10/2012
TECHNIQUE: Multiple axial images from the foramen magnum to the vertex were
obtained without IV contrast.

[Series 4: soft tissue recon · axial · 0.42mm/px · z∈[-332,-197]mm · 8 of 35 slices shown, 10 images]
[im 4/35  brain]
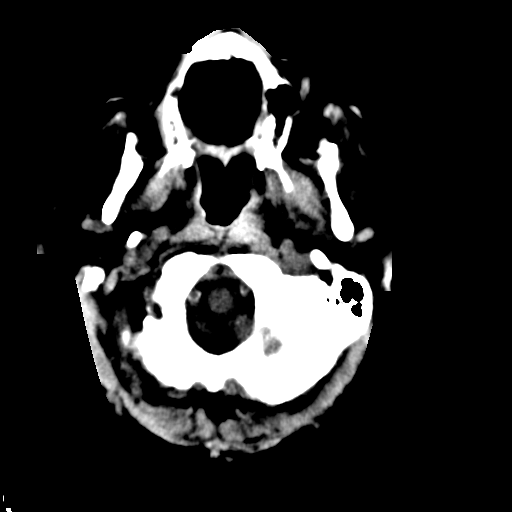
[im 4/35  bone]
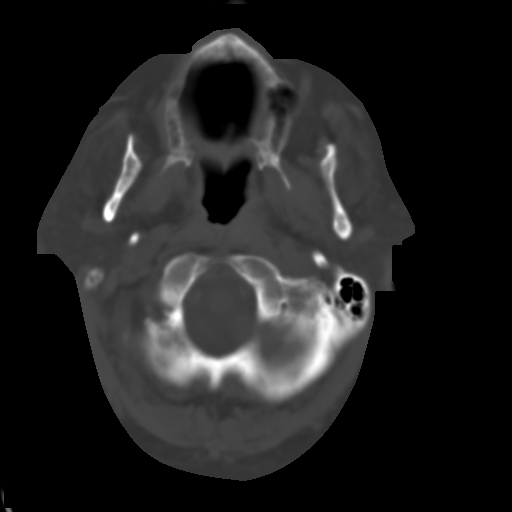
[im 8/35  brain]
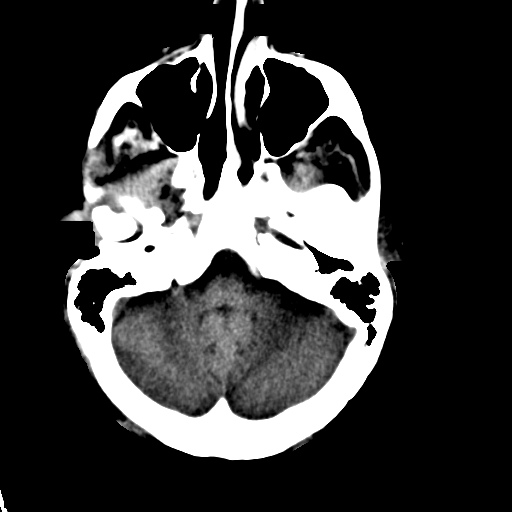
[im 12/35  brain]
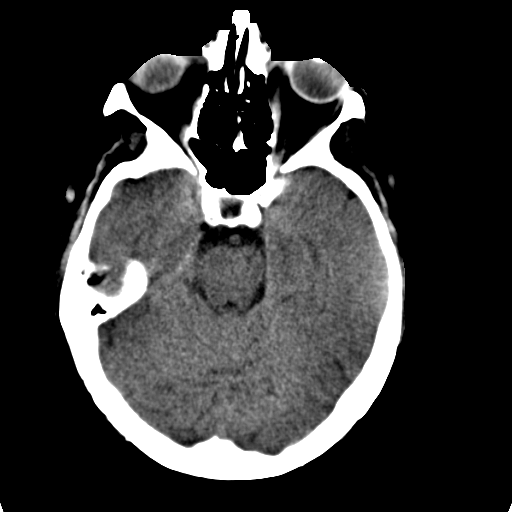
[im 16/35  brain]
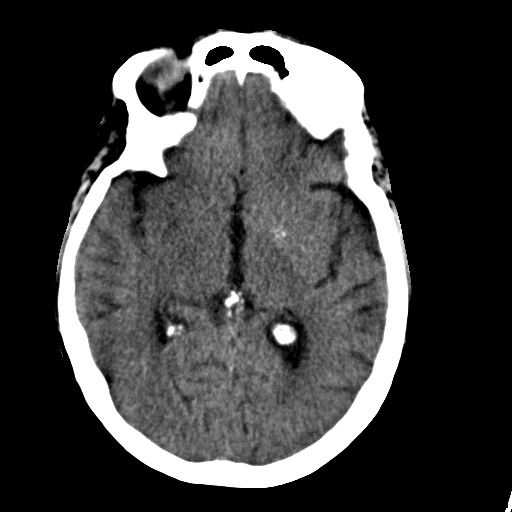
[im 19/35  brain]
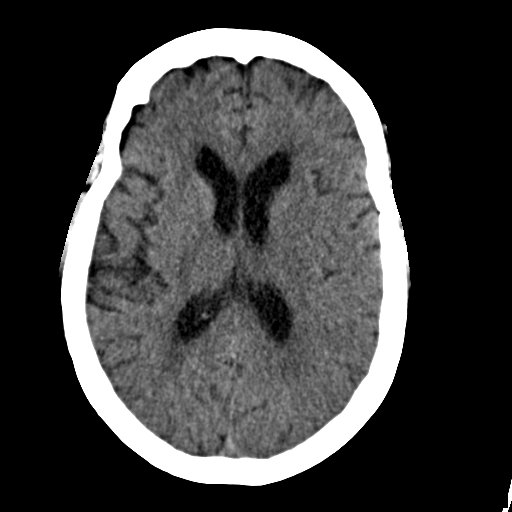
[im 19/35  bone]
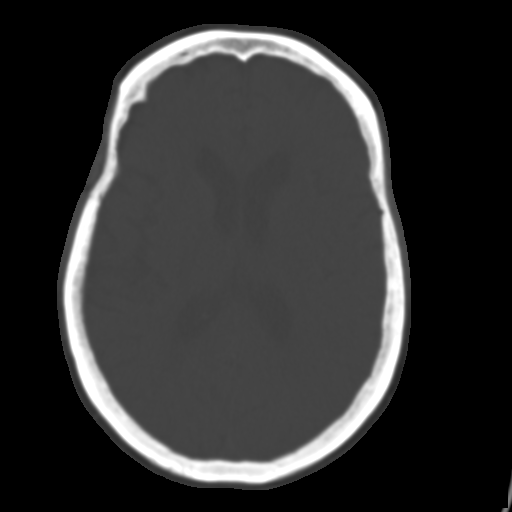
[im 23/35  brain]
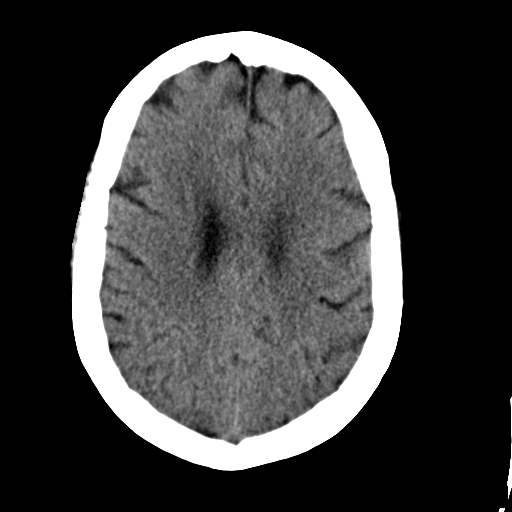
[im 27/35  brain]
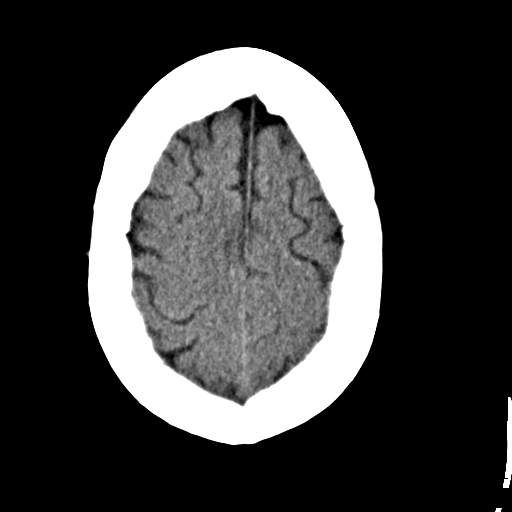
[im 31/35  brain]
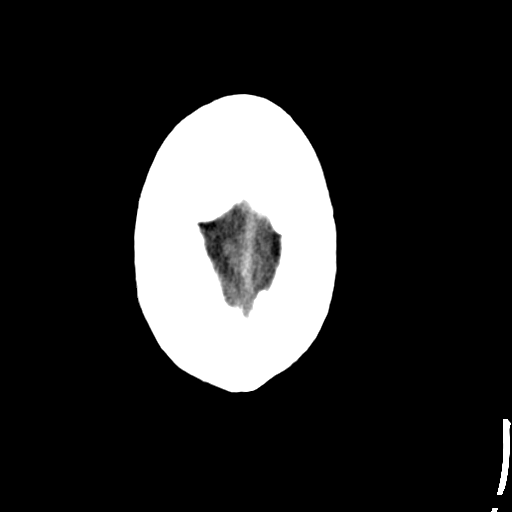

[Series 5: bone recon · axial · 0.42mm/px · z∈[-329,-195]mm · 8 of 35 slices shown]
[im 4/35  bone]
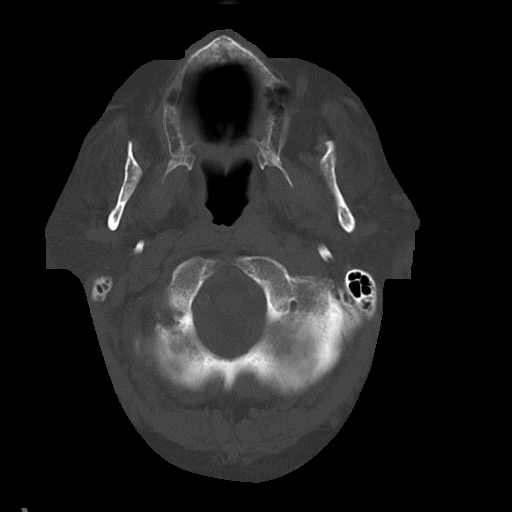
[im 8/35  bone]
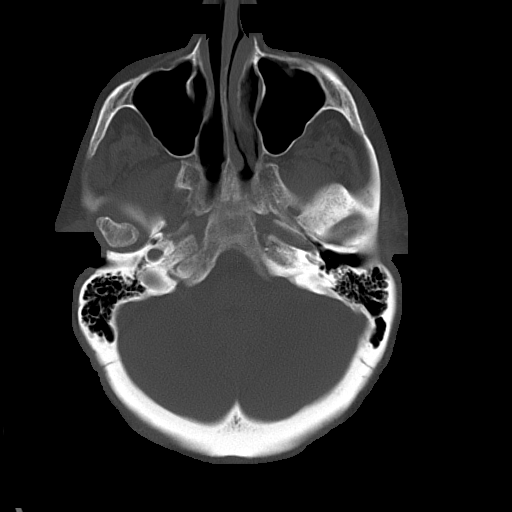
[im 12/35  bone]
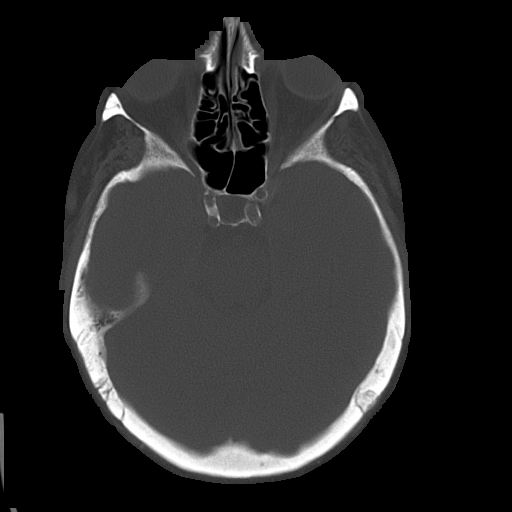
[im 16/35  bone]
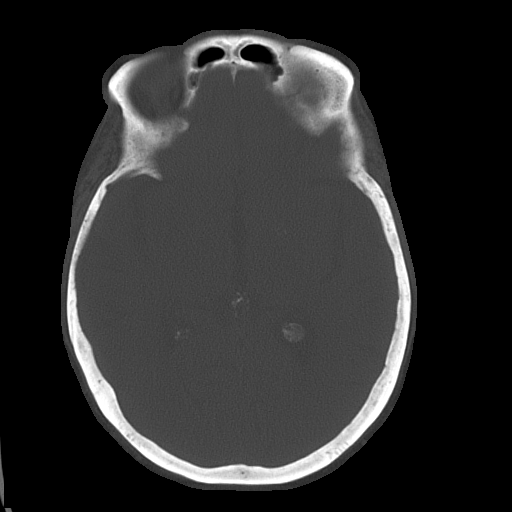
[im 19/35  bone]
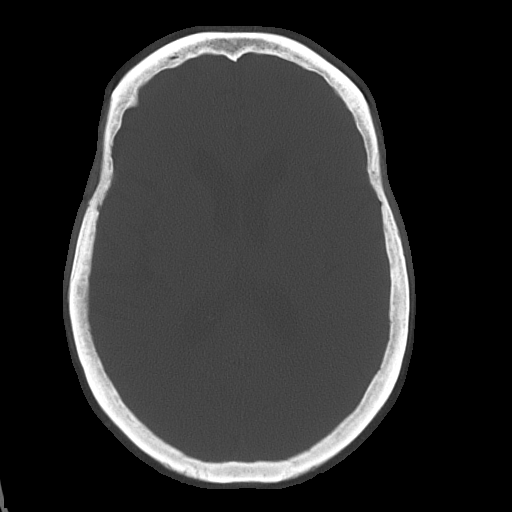
[im 23/35  bone]
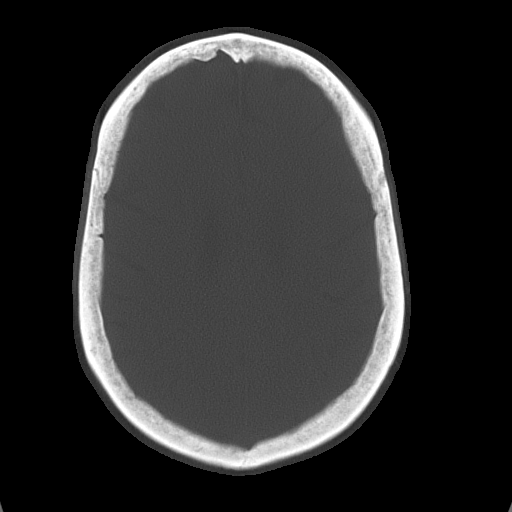
[im 27/35  bone]
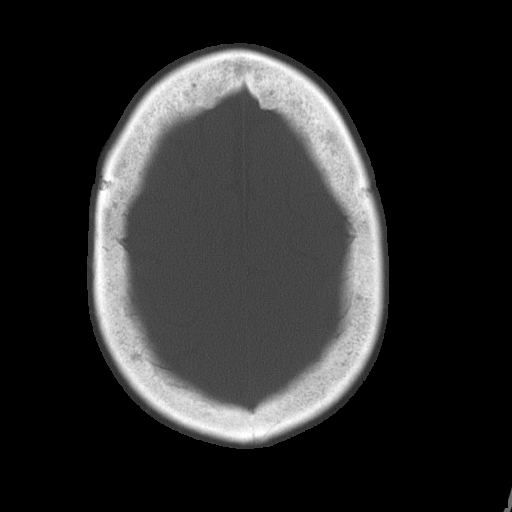
[im 31/35  bone]
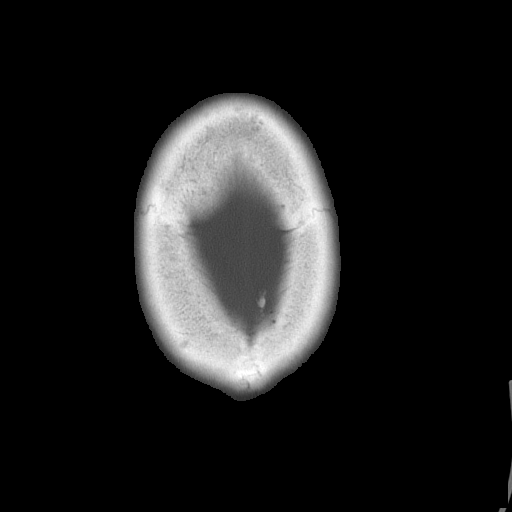

[16 of 30 positions shown; findings below may reference images not displayed]

FINDINGS: There is no evidence of mass effect, midline shift, or extra-axial fluid
collections.  There is no evidence of a space-occupying lesion or
intracranial hemorrhage. There is no evidence of a cortical-based area of
acute infarction. There is generalized cerebral atrophy. There is
periventricular white matter low attenuation likely secondary to
microangiopathy.

The ventricles and sulci are appropriate for the patient's age. The basal
cisterns are patent.

Visualized portions of the orbits are unremarkable. The visualized portions
of the paranasal sinuses and mastoid air cells are unremarkable.
Cerebrovascular atherosclerotic calcifications are noted.

The osseous structures are unremarkable.
IMPRESSION: No acute intracranial process.

[REDACTED]

## 2015-03-13 NOTE — Consult Note (Signed)
General Aspect 64 year old female who has a history of diabetes, hypertension, colon cancer, previous infection of MRSA on her back after instrumentation, dyslipidemia who has been having URI sx at home, 24 hours of dizzy and weakness,  confusion with memory loss, urinary frequency, presenting after being found down, with seizures. Cardiology was consulted for atrial fib that developed on resusitation, notes mentioning VT.  Per the daughter, the patient has been feeling not so well for one week, with body aches, fever, neck pain, back, joint and muscle pain,  and diarrhea.  She was found on the floor by family, with a scratch on the left face, unresponsive. She had a pulse, lips were purple.  EMS was called,  she was having significant altered mental status, seizing activity.  In the ER, She was intubated, given several medications for her seizures, including lorazepam, midazolam, phenobarbital and Keppra, until she stopped seizing.   CT scan of the head did not show any bleeding or strokes. Because of her history of febrile illness, the patient had an LP, and she was given medications to cover for meningitis. Her LP came back with no signs of significant infection. Her blood sugars were above 600,  insulin infusion was started.   Notes indiacte that the patient developed ventricular tachycardia that resolved after 300 mg bolus of amiodarone. After that, she converted into atrial fibrillation, then junctional rhythm. Converted to NSR with AM.    Present Illness . ALLERGIES: THE PATIENT IS ALLERGIC TO SULFA.   PAST SURGICAL HISTORY: The patient had a colectomy in 2002 and back surgery as well. She has multiple complications of her back surgery with an abscess and MRSA infection for what she had several I and D and removal of some of her hardware.   FAMILY HISTORY: Coronary artery disease in her father.   SOCIAL HISTORY: The patient lives with her daughter. She does not smoke. She does not drink.  She is disabled due to her back pain. She is divorced.   Physical Exam:   GEN well developed, obese    HEENT pink conjunctivae    NECK supple    RESP intubated    CARD Regular rate and rhythm    ABD soft    LYMPH negative neck    EXTR negative edema    SKIN normal to palpation    NEURO moving her feet    PSYCH sedated   Review of Systems:   Subjective/Chief Complaint intubated and sedated    ROS Pt not able to provide ROS     Multi-drug Resistant Organism (MDRO): Positive culture for MRSA., 01-Jul-2012   Other, see comments: wond to left foot   Colon or Rectal Cancer:    Other, see comments: diabetic neuropathy   Depression:    Hypercholesterolemia:    Asthma:    Hypertension:    Diabetes:    Multi-drug Resistant Organism (MDRO): Positive culture for MRSA., 14-Nov-2010   Colon Resection:    Back Surgery:   Home Medications: Medication Instructions Status  gabapentin 800 mg oral tablet 1 tab(s) orally 3 times a day Active  Toviaz 4 mg oral tablet, extended release 1 tab(s) orally 2 times a day Active  montelukast 10 mg oral tablet 1 tab(s) orally once a day (at bedtime) Active  sertraline 100 mg oral tablet 2 tab(s) orally once a day Active  Bystolic 10 mg oral tablet 1 tab(s) orally once a day Active  torsemide 20 mg oral tablet 1 tab(s) orally once  a day Active  pantoprazole 40 mg oral enteric coated tablet 1 tab(s) orally once a day Active  tramadol 50 mg oral tablet 1 tab(s) orally 2 times a day, As Needed- for Pain  Active  Nasonex 50 mcg/inh nasal spray 2 spray(s) nasal once a day Active  Lunesta 3 mg oral tablet 1 tab(s) orally once a day (at bedtime) Active  diphenhydrAMINE 25 mg oral tablet 1 tab(s) orally once a day (at bedtime) Active  RABEprazole 20 mg oral delayed release tablet 1 tab(s) orally once a day Active  Benicar HCT 40 mg-25 mg oral tablet 1 tab(s) orally once a day Active  Aspir 81 oral enteric coated tablet 1 tab(s) orally  once a day Active  Crestor 20 mg oral tablet 1 tab(s) orally once a day Active  potassium chloride 10 mEq oral tablet, extended release 1 tab(s) orally once a day Active  Lantus Solostar Pen 100 units/mL subcutaneous solution 80 unit(s) subcutaneous 2 times a day Active  Victoza 18 mg/3 mL subcutaneous solution 1.8 milligram(s) subcutaneous once a day Active   Lab Results:  Routine Chem:  19-Dec-13 03:35    Potassium, Serum  3.1   Result Comment troponin - RESULTS VERIFIED BY REPEAT TESTING.  - READ-BACK PROCESS PERFORMED.  - c/myra dove 12/19/13at [id number]  Result(s) reported on 11 Nov 2012 at 04:21AM.   Hemoglobin A1c [facility])  10.4 (The American Diabetes Association recommends that a primary goal of therapy should be <7% and that physicians should reevaluate the treatment regimen in patients with HbA1c values consistently >8%.)   Glucose, Serum  465   BUN  24   Creatinine (comp)  1.41   Sodium, Serum 138   Chloride, Serum 104   CO2, Serum 23   Calcium (Total), Serum 9.6   Anion Gap 11   Osmolality (calc) 300   eGFR (African American)  47   eGFR (Non-African American)  41 (eGFR values <66m/min/1.73 m2 may be an indication of chronic kidney disease (CKD). Calculated eGFR is useful in patients with stable renal function. The eGFR calculation will not be reliable in acutely ill patients when serum creatinine is changing rapidly. It is not useful in  patients on dialysis. The eGFR calculation may not be applicable to patients at the low and high extremes of body sizes, pregnant women, and vegetarians.)   Magnesium, Serum  1.6 (1.8-2.4 THERAPEUTIC RANGE: 4-7 mg/dL TOXIC: > 10 mg/dL  -----------------------)   Cholesterol, Serum 133   Triglycerides, Serum 109   HDL (INHOUSE) 47   VLDL Cholesterol Calculated 22   LDL Cholesterol Calculated 64 (Result(s) reported on 11 Nov 2012 at 04:10AM.)  Cardiac:  19-Dec-13 03:35    Troponin I  1.50 (0.00-0.05 0.05 ng/mL or less:  NEGATIVE  Repeat testing in 3-6 hrs  if clinically indicated. >0.05 ng/mL: POTENTIAL  MYOCARDIAL INJURY. Repeat  testing in 3-6 hrs if  clinically indicated. NOTE: An increase or decrease  of 30% or more on serial  testing suggests a  clinically important change)  Routine Hem:  19-Dec-13 03:35    WBC (CBC)  18.6   RBC (CBC) 4.69   Hemoglobin (CBC)  11.3   Hematocrit (CBC) 35.6   Platelet Count (CBC) 260   MCV  76   MCH  24.0   MCHC  31.6   RDW  16.4   Neutrophil % 93.5   Lymphocyte % 3.6   Monocyte % 2.8   Eosinophil % 0.0   Basophil % 0.1  Neutrophil #  17.4   Lymphocyte #  0.7   Monocyte # 0.5   Eosinophil # 0.0   Basophil # 0.0 (Result(s) reported on 11 Nov 2012 at 03:55AM.)   EKG:   Interpretation EKG at 12/18 at 20:00 shows  atrial fib, unable to exclude accelerated junctional rhythm EKG 12/18 21:00 shows junctional rhythm rate 81 bpm Tele strips at 12/19 4 am showing sinus rhythm rate 70 bpm    Doxycycline: Itching, N/V/Diarrhea, Hives  Sulfa drugs: Unknown  Codeine: Unknown  Vital Signs/Nurse's Notes: **Vital Signs.:   19-Dec-13 18:00   Vital Signs Type Routine   Temperature Temperature (F) 98.5   Celsius 36.9   Temperature Source oral   Pulse Pulse 66   Respirations Respirations 22   Systolic BP Systolic BP 666   Diastolic BP (mmHg) Diastolic BP (mmHg) 71   Mean BP 99   Pulse Ox % Pulse Ox % 99   Pulse Ox Activity Level  At rest   Oxygen Delivery Ventilator Assisted   Pulse Ox Heart Rate 66     Impression 1. Acute encephalopathy - seizure, uncontrolled DM, reecnt infection (URI?) on  empiric rocephin and vancomycin Supportive care  2. arrhythmia Appears to have had atrial fib (wide complex), unable to exclude accelerated junctional rhythm Follow up rate was junction rhythm, then converted to NSR Maintained NSR all day, still on amio at 0.5 mg/min Arrhythmia from recent events, seizure, hypoxia Now appears stable ECHO pending  3.  elevated troponin,  - asa, metoprolol, no heparin with seizure Demand ischemia from hypoxia, tachycardia Troponin trending down 1.5 to 1.3 No plan for invasive testing at this time  4. status epilepticus - iv keppra,   5. acute respiratory failure - ventilator support with her altered mental status,   30% fio2   Electronic Signatures: Ida Rogue (MD)  (Signed 19-Dec-13 19:23)  Authored: General Aspect/Present Illness, History and Physical Exam, Review of System, Past Medical History, Home Medications, Labs, EKG , Allergies, Vital Signs/Nurse's Notes, Impression/Plan   Last Updated: 19-Dec-13 19:23 by Ida Rogue (MD)

## 2015-03-13 NOTE — Consult Note (Signed)
Referring Physician:  James Ivanoff, Roselie Awkward :   Primary Care Physician:  James Ivanoff, [facility] : [facility], [address], [place], [place] [address], [place]  [place], [place] : [address] [place], [place], [place] [address]  Reason for Consult:  Admit Date: 10-Nov-2012   Chief Complaint: seizure   Reason for Consult: seizure   History of Present Illness:  History of Present Illness:   HISTORY OF PRESENT ILLNESS: woman with a history of diabetes, hypertension, previous colon cancer, previous infection of MRSA who presents with altered mental status over the past 24 hours and then a recent first lifetime seizure.  Neurology is asked to evaluate for AMS and seizures.   has occurred in the setting of general fatigue, aches, low grade fevers and cold symptoms over the past week or so concerning for a recent viral infection.  Yesterday a family member came home and found the patient lying on the floor with scratches on her face with decreased level of consciousness.  EMS was called and she was then seen to have seizure activity.  She was intubated and started on lorazepam, midazolam, phenobarbital and Keppra until she eventually stopped seizing.  HCT was unremarkable.  LP showed increased protein but was otherwise unremarkable.  Her blood sugars have been found to be profoundly elevated in the 600s and she has been started on insulin as well.   nurse reports that her blood pressures have been stable since admission. patient has been on her versed since around 9am.   reports that the patient was posturing earlier to noxious stim and also grimacing. OF SYSTEMS: Unable to obtain due to altered mental status and intubated in ICU.  MEDICAL HISTORY: Diabetes, insulin dependent. Hypertension. History of colon cancer. History of MRSA infection on the back. Neuropathy. Depression and anxiety. Dyslipidemia. Seasonal allergies. Peripheral edema.  SURGICAL HISTORY: Colectomy and back  surgery in 2002.  She has had multiple complications from the back surgery including an abscess and MRSA infection. HISTORY: Coronary artery disease.   HISTORY: Lives with her daughter. She does not smoke. She does not drink. She is disabled due to her back pain. She is divorced.  MEDICATIONS: Victoza 1.8 mg once a day, tramadol 50 mg 2 times a day p.r.n. pain, Toviaz 4 mg twice daily, torsemide 20 mg once daily, sertraline 100 mg 2 tablets once daily, rabeprazole 20 mg once a day, potassium chloride 10 mg once a day, pantoprazole 40 mg once a day, Nasonex 2 sprays once a day, Singular 10 mg once a day, Lunesta 3 mg at bedtime, Lantus 80 units 2 times a day, gabapentin 800 mg 3 times daily, diphenhydramine 25 mg once a day, Crestor 20 mg once a day, Bystolic 10 mg once a day, Benicar HCT 40/25 mg once a day, aspirin 81 mg once a day.   ALLERGIES: SULFA.   GENERAL: Intubated in ICU.  Normocephalic and atraumatic. exam shows normal disc size, appearance and C/D ratio without clear evidence of papilledema. and S2 sounds are within normal limits, without murmurs, gallops, or rubs. - Obese- NormalDrift - Unable to test due to altered mental status and intubated in ICU.  Ambulation - Unable to test due to altered mental status and intubated in ICU.   Unable to test due to altered mental status and intubated in ICU. is no withdrawal to posturing to noxious stim on my exam this evening. STATUS: Unable to test due to altered mental status and intubated in ICU.  NERVES:cough and gag, pupils are sluggishly responsive to light.  REFLEXES: Reflexes are trace throughout.  Unable to test due to altered mental status and intubated in ICU.   64 year old woman with a history of diabetes, hypertension, previous colon cancer, previous infection of MRSA who presents with altered mental status over the past 24 hours and then a recent first lifetime seizure.  Neurology is asked to evaluate for AMS and seizures.    patient has had a first lifetime seizure.  Her EEG was unremarkable and negative for interictal or seizure activity.  Her seizure has occurred in the setting of markedly elevated blood sugars so this is a provoked seizure.  Given the new seizure in the setting of critical illness, I would recommend continuing the patient on Keppra for the time being.  She has present but diminished brainstem reflexes which is positive.  I was unable to elicit any consistent response to noxious extremity stim this evening.  I have reviewed the HCT which shows moderate global atrophy without clear focality.  Given her obesity it is likely that the versed is still in he system and the levels stored in fat would be expected to gradually diminish.  If the patient continues to have a depressed level of consciousness by Friday afternoon then I would consider repeating the HCT since it would have been more than 24 hours after the initial event and any potential ischemic event should be able to be seen.  I would rec continuing on the [place] even if the patient's cognition improves and this can be gradually tapered down as an outpatient if there are no additional seizures.   Melrose Nakayama, MD  Past Medical/Surgical Hx:  Multi-drug Resistant Organism (MDRO): Positive culture for MRSA.  Other, see comments: wond to left foot  Colon or Rectal Cancer:   Other, see comments: diabetic neuropathy  Depression:   Hypercholesterolemia:   Asthma:   Hypertension:   Diabetes:   Multi-drug Resistant Organism (MDRO): Positive culture for MRSA.  Colon Resection:   Back Surgery:   Home Medications: Medication Instructions Last Modified Date/Time  Lantus Solostar Pen 100 units/mL subcutaneous solution 80 unit(s) subcutaneous 2 times a day 18-Dec-13 17:57  Victoza 18 mg/3 mL subcutaneous solution 1.8 milligram(s) subcutaneous once a day 18-Dec-13 17:57  potassium chloride 10 mEq oral tablet, extended release 1 tab(s) orally once a day 18-Dec-13  17:57  Crestor 20 mg oral tablet 1 tab(s) orally once a day 18-Dec-13 17:57  Aspir 81 oral enteric coated tablet 1 tab(s) orally once a day 18-Dec-13 17:57  Benicar HCT 40 mg-25 mg oral tablet 1 tab(s) orally once a day 18-Dec-13 17:57  RABEprazole 20 mg oral delayed release tablet 1 tab(s) orally once a day 18-Dec-13 17:57  diphenhydrAMINE 25 mg oral tablet 1 tab(s) orally once a day (at bedtime) 18-Dec-13 17:57  Lunesta 3 mg oral tablet 1 tab(s) orally once a day (at bedtime) 18-Dec-13 17:57  Nasonex 50 mcg/inh nasal spray 2 spray(s) nasal once a day 18-Dec-13 17:57  tramadol 50 mg oral tablet 1 tab(s) orally 2 times a day, As Needed- for Pain  18-Dec-13 17:57  pantoprazole 40 mg oral enteric coated tablet 1 tab(s) orally once a day 18-Dec-13 17:57  torsemide 20 mg oral tablet 1 tab(s) orally once a day [id number]-KCM-03 49:17  Bystolic 10 mg oral tablet 1 tab(s) orally once a day 18-Dec-13 17:57  sertraline 100 mg oral tablet 2 tab(s) orally once a day 18-Dec-13 17:57  montelukast 10 mg oral tablet 1 tab(s)  orally once a day (at bedtime) 18-Dec-13 17:57  Toviaz 4 mg oral tablet, extended release 1 tab(s) orally 2 times a day 18-Dec-13 17:57  gabapentin 800 mg oral tablet 1 tab(s) orally 3 times a day 18-Dec-13 17:57   [facility] Current Meds:   Amiodarone Drip, ( Cordarone Drip ) 360 mg /D5W 200 ml (premix)  Concentration: (1.8 mg/ml), Dose Rate: 33.3 ml/hr at initial rate of 33.3 ml/hr  -Indication:Tachycardia/ Atrial Fibrillation, Instructions: To deliver 360 mg over 6 hours infuse at 33.3 ml/hr, then to deliver 540 mg over next 18 hours, decrease rate to 16.6 ml/hr  Midazolam Drip,  ( Versed Drip ) 50 mg NS 100 ml - (premix)  Concentration: (0.5 mg/ml), Dose Rate: 0.03 mg/kg/hr, Titrate  -Indication:Mechanical Ventilation Sedation, Instructions: Start at 0.69m/kg/hr and titrate.  Insulin Drip Regular Ratio 1:1, 250 unit(s) in Sodium Chloride 0.9% 250 ml, Dose Rate: 6 units/hr at initial rate of  6 ml/hr  -Indication:Diabetes, Instructions: Insulin Drip per CCU Vent/CRRT Hyperglycemia Protocol.  Check Blood Sugars every 1 hour.  [Waste Code: Black]  (Bag must be discarded and changed every 24 hrs), Admin Instructions: <<<Bag must be changed every 24 hours>>>  Sodium Chloride 0.9% w/KCl 261m/L, 1000 ml at 50 ml/hr  Acetaminophen * tablet, ( Tylenol (325 mg) tablet)  650 mg Oral q4h PRN for pain or temp. greater than 100.4 suppository is ok  - Indication: Pain/Fever  Enoxaparin injection, ( Lovenox injection )  40 mg, Subcutaneous, daily  Indication: Prophylaxis or treatment of thromboembolic disorders, Monitor Anticoags per hospital protocol  Ondansetron injection, ( Zofran injection )  4 mg, IV push, q4h PRN for Nausea/Vomiting  Indication: Nausea/ Vomiting  Promethazine injection, ( Phenergan injection )  6.25 to 12.5 mg, Intramuscular, q4h PRN for nausea, vomiting  Indication: Antiemetic/ Motion Sickness/ Sedative/ Antihistamine  Chlorhexidine 0.12% liquid in Kit, 5 ml Oral q12h  -Indication:For mouth care  Instructions:  **Swab Mouth and brush teeth/gums.  (within kit from materials management)  Oseltamivir Capsule,  ( Tamiflu)  75 mg Nasogastric q12h  Stop After: 10 Doses  Labetalol injection, ( Trandate injection )  10 mg, IV push, q4h PRN for sbp>160  Indication: Hypertension  levETIRAcetam injection,  ( Keppra injection )  500 mg in Dextrose 5% 100 ml, IV Piggyback, q12h, Infuse over 15 minute(s), Mixed in 100 ml given over 15 minutes.  LORazepam injection,  ( Ativan injection )  2 mg, IV push, q1h PRN for seizures  Indication: Anxiety/ Seizure/ Antiemetic Adjunct/ Preop Sedation  Influenza Virus Trivalent Vaccine injection, 0.5 ml, Intramuscular, atdischarge  Indication: Provide Active Immunity to Influenza Strains contained in Vaccine, GIVE ON DISCHARGE DAY.  Pneumococcal 23-valent Vaccine, 0.5 ml, Intramuscular, atdischarge  Indication: Pneumococcal Immunization,  0.55m57mM once (Stored in Pyxis Refrigerator)  Aspirin Chewable, 324 mg Nasogastric daily  - Indication: Pain/Fever/Thromboembolic Disorders/Post MI/Prophylaxis MI  meTOProlol tartrate tablet, ( Lopressor)  12.5 mg Nasogastric q12h  - Indication: Antihypertensive/ Angina  Instructions:  hold if hr less than 65  Famotidine injection, ( Pepcid injection )  20 mg, IV Piggyback, q48h, Infuse over 30 minute(s)  Indication: Active Ulcer/Hypersecretory Conditions/Heartburn/Paclitaxel Reactions  Albuterol-Ipratropium Oral inhaler, CCU VENT ONLY ( Combivent Oral inhaler )  8 puff(s) Inhalation q4h  Instructions:  SHAKE WELL  [Waste Code: SendToRx]  Chlorhexidine 0.12% liquid in Kit, 5 ml Oral q12h  -Indication:For mouth care  Instructions:  **Swab Mouth*** Use after brushing teeth or gums.  (within kit from materials management)  Fluticasone HFA 220m54m  inhaler,  ( Flovent HFA 234mg inhaler )  2 puff(s) Inhalation q12h  Instructions:  [Waste Code: SendToRx]  Mupirocin 2% Nasal ointment,  ( Bactroban Nasal ointment )  1 application Intranasal bid  Stop After: 10 Doses  Nystatin Topical Powder, ( Mycostatin powder )  Apply to affected area bid  -Indication:Fungal Infection  Allergies:  Doxycycline: Itching, N/V/Diarrhea, Hives  Sulfa drugs: Unknown  Codeine: Unknown  Vital Signs: **Vital Signs.:   19-Dec-13 10:00   Vital Signs Type Routine   Pulse Pulse 62   Respirations Respirations 14   Systolic BP Systolic BP 1882  Diastolic BP (mmHg) Diastolic BP (mmHg) 72   Mean BP 95   Pulse Ox % Pulse Ox % 99   Pulse Ox Activity Level  At rest   Oxygen Delivery Ventilator Assisted   Pulse Ox Heart Rate 62   Lab Results:  Hepatic:  18-Dec-13 17:47    Bilirubin, Total 0.3   Alkaline Phosphatase  224   SGPT (ALT) 56   SGOT (AST)  88   Total Protein, Serum  9.2   Albumin, Serum 3.9  Routine Micro:  18-Dec-13 20:11    Specimen Source INDWELLING CATH    20:50    Micro Text Report CSF  CULTURE/AERO/GRAM ST   COMMENT                   NO GROWTH IN 8-12 HOURS   GRAM STAIN                RARE WHITE BLOOD CELLS   GRAM STAIN                FEW RED BLOOD CELLS   GRAM STAIN                NO ORGANISMS SEEN   ANTIBIOTIC             Culture Comment NO GROWTH IN 8-12 HOURS   Gram Stain 1 RARE WHITE BLOOD CELLS   Gram Stain 2 FEW RED BLOOD CELLS   Gram Stain 3 NO ORGANISMS SEEN  Result(s) reported on 11 Nov 2012 at 10:26AM.  General Ref:  18-Dec-13 16:47    Acetaminophen, Serum < 2.0 (10-30 POTENTIALLY TOXIC:  > 200 mcg/mL  > 50 mcg/mL at 12 hr after  ingestion  > 300 mcg/mL at 4 hr after  ingestion)   Salicylates, Serum < 1.7 (0.0-2.8 Therapeutic 2.8-20.0 mg/dL Toxic >30.0 mg/dL)  Routine Chem:  18-Dec-13 17:47    Lipase 370 (Result(s) reported on 10 Nov 2012 at 06:34PM.)   Phosphorus, Serum  7.2 (Result(s) reported on 10 Nov 2012 at 06:34PM.)  19-Dec-13 03:35    Magnesium, Serum  1.6 (1.8-2.4 THERAPEUTIC RANGE: 4-7 mg/dL TOXIC: > 10 mg/dL  -----------------------)   Potassium, Serum  3.1   Result Comment troponin - RESULTS VERIFIED BY REPEAT TESTING.  - READ-BACK PROCESS PERFORMED.  - c/myra dove 12/19/13at [id number]  Result(s) reported on 11 Nov 2012 at 04:21AM.   Hemoglobin A1c ([facility]  10.4 (The American Diabetes Association recommends that a primary goal of therapy should be <7% and that physicians should reevaluate the treatment regimen in patients with HbA1c values consistently >8%.)   Glucose, Serum  465   BUN  24   Creatinine (comp)  1.41   Sodium, Serum 138   Chloride, Serum 104   CO2, Serum 23   Calcium (Total), Serum 9.6   Anion Gap 11   Osmolality (calc) 300  eGFR (African American)  47   eGFR (Non-African American)  41 (eGFR values <35m/min/1.73 m2 may be an indication of chronic kidney disease (CKD). Calculated eGFR is useful in patients with stable renal function. The eGFR calculation will not be reliable in acutely ill patients when  serum creatinine is changing rapidly. It is not useful in  patients on dialysis. The eGFR calculation may not be applicable to patients at the low and high extremes of body sizes, pregnant women, and vegetarians.)   Cholesterol, Serum 133   Triglycerides, Serum 109   HDL (INHOUSE) 47   VLDL Cholesterol Calculated 22   LDL Cholesterol Calculated 64 (Result(s) reported on 11 Nov 2012 at 04:10AM.)  Urine Drugs:  [id number]   Tricyclic Antidepressant, Ur Qual (comp) NEGATIVE (Result(s) reported on 10 Nov 2012 at 09:40PM.)   Amphetamines, Urine Qual. NEGATIVE   MDMA, Urine Qual. NEGATIVE   Cocaine Metabolite, Urine Qual. NEGATIVE   Opiate, Urine qual NEGATIVE   Phencyclidine, Urine Qual. NEGATIVE   Cannabinoid, Urine Qual. NEGATIVE   Barbiturates, Urine Qual. NEGATIVE   Benzodiazepine, Urine Qual. POSITIVE (----------------- The URINE DRUG SCREEN provides only a preliminary, unconfirmed analytical test result and should not be used for non-medical  purposes.  Clinical consideration and professional judgment should be  applied to any positive drug screen result due to possible interfering substances.  A more specific alternate chemical method must be used in order to obtain a confirmed analytical result.  Gas chromatography/mass spectrometry (GC/MS) is the preferred confirmatory method.)   Methadone, Urine Qual. NEGATIVE  Cardiac:  18-Dec-13 17:47    CK, Total 58   CPK-MB, Serum 1.3 (Result(s) reported on 10 Nov 2012 at 06:34PM.)  19-Dec-13 03:35    Troponin I  1.50 (0.00-0.05 0.05 ng/mL or less: NEGATIVE  Repeat testing in 3-6 hrs  if clinically indicated. >0.05 ng/mL: POTENTIAL  MYOCARDIAL INJURY. Repeat  testing in 3-6 hrs if  clinically indicated. NOTE: An increase or decrease  of 30% or more on serial  testing suggests a  clinically important change)  Routine UA:  18-Dec-13 20:11    Color (UA) Colorless   Clarity (UA) Clear   Glucose (UA) >=500   Bilirubin (UA)  Negative   Ketones (UA) Negative   Specific Gravity (UA) 1.016   Blood (UA) Negative   pH (UA) 6.0   Protein (UA) 100 mg/dL   Nitrite (UA) Negative   Leukocyte Esterase (UA) Negative (Result(s) reported on 10 Nov 2012 at 09:48PM.)   RBC (UA) 1 /HPF   WBC (UA) NONE SEEN   Bacteria (UA) TRACE   Epithelial Cells (UA) NONE SEEN   Mucous (UA) PRESENT (Result(s) reported on 10 Nov 2012 at 09:48PM.)  CSF Analysis:  18-Dec-13 20:50    CSF Tube # 3   Color - Uncentrifuged (CSF) COLORLESS   Clarity (CSF) CLEAR   RBC  (CSF) 19   WBC  (CSF) 0   Neutrophils  (CSF) 0   Lymphocytes  (CSF) 0   Monocytes/Macrophages  (CSF) 0   Eosinophil  (CSF) 0   Other Cells  (CSF) 0 (Result(s) reported on 10 Nov 2012 at 09:57PM.)   Color - Centrifuged (CSF) -   RBC (CSF) -   WBC (CSF) - (Diff performed from concentrated specimen.)   Neutrophils (CSF) -   Lymphocytes (CSF) -   Monocytes/Macrophages (CSF) -   Eosinophil (CSF) -   Other Cells (CSF) -   Glucose, CSF  296 (Result(s) reported on 10 Nov 2012 at 09:38PM.)   Protein, CSF  79 (Result(s) reported on 10 Nov 2012 at 09:38PM.)  Routine Coag:  18-Dec-13 17:47    Activated PTT (APTT) 28.6 (A HCT value >55% may artifactually increase the APTT. In one study, the increase was an average of 19%. Reference: "Effect on Routine and Special Coagulation Testing Values of Citrate Anticoagulant Adjustment in Patients with High HCT Values." American Journal of Clinical Pathology 2006;126:400-405.)   Prothrombin 13.7   INR 1.0 (INR reference interval applies to patients on anticoagulant therapy. A single INR therapeutic range for coumarins is not optimal for all indications; however, the suggested range for most indications is 2.0 - 3.0. Exceptions to the INR Reference Range may include: Prosthetic heart valves, acute myocardial infarction, prevention of myocardial infarction, and combinations of aspirin and anticoagulant. The need for a higher or lower  target INR must be assessed individually. Reference: The Pharmacology and Management of the Vitamin K  antagonists: the seventh ACCP Conference on Antithrombotic and Thrombolytic Therapy. ZOXWR.6045 Sept:126 (3suppl): [id number]. A HCT value >55% may artifactually increase the PT.  In one study,  the increase was an average of 25%. Reference:  "Effect on Routine and Special Coagulation Testing Values of Citrate Anticoagulant Adjustment in Patients with High HCT Values." American Journal of Clinical Pathology 2006;126:400-405.)  Routine Hem:  19-Dec-13 03:35    WBC (CBC)  18.6   RBC (CBC) 4.69   Hemoglobin (CBC)  11.3   Hematocrit (CBC) 35.6   Platelet Count (CBC) 260   MCV  76   MCH  24.0   MCHC  31.6   RDW  16.4   Neutrophil % 93.5   Lymphocyte % 3.6   Monocyte % 2.8   Eosinophil % 0.0   Basophil % 0.1   Neutrophil #  17.4   Lymphocyte #  0.7   Monocyte # 0.5   Eosinophil # 0.0   Basophil # 0.0 (Result(s) reported on 11 Nov 2012 at 03:55AM.)   Radiology Results: CT:    18-Dec-13 20:01, CT Head Without Contrast   CT Head Without Contrast    REASON FOR EXAM:    status epoilepticus, hi sugar and trauma to left face  COMMENTS:       PROCEDURE: CT  - CT HEAD WITHOUT CONTRAST  - Nov 10 2012  8:01PM     RESULT: Technique: Helical noncontrasted 5 mm sections were obtained from   the skull basethrough the vertex.    Findings: Diffuse cortical and cerebellar atrophy is identified as well   as diffuse areas of low attenuation within the subcortical, deep and   periventricular white matter regions. There is not evidence of   intra-axial nor extra-axial fluid collections, acute hemorrhage, mass   effect, nor a depressed skull fracture. The visualized paranasal sinuses   and mastoid air cells are patent.  IMPRESSION:  Chronic and involutional changes without evidence of acute   abnormalities. If there is persistent clinical concern further evaluation   with MRI is  recommended.  2. Comparison made to prior study dated 11/29/2010           Thank you for the opportunity to contribute to the care of your patient.        Verified By: Mikki Santee, M.D., MD   Electronic Signatures: Anabel Bene (MD)  (Signed 20-Dec-13 02:07)  Authored: REFERRING PHYSICIAN, Primary Care Physician, Consult, History of Present Illness, PAST MEDICAL/SURGICAL HISTORY, HOME MEDICATIONS, Current Medications, ALLERGIES, NURSING VITAL SIGNS, LAB RESULTS, RADIOLOGY  RESULTS   Last Updated: 20-Dec-13 02:07 by Anabel Bene (MD)

## 2015-03-16 NOTE — Discharge Summary (Signed)
PATIENT NAME:  Yvette Chavez, Yvette Chavez MR#:  [id number] DATE OF BIRTH:  1951/11/13  DATE OF ADMISSION:  11/10/2012 DATE OF DISCHARGE:  11/22/2012  Please see interim discharge summary dictated by Dr. Tressia Miners on November 19, 2012 for hospital course up until that point. Please also see admitting H and P by Dr. Laurin Coder.   CONSULTANTS DURING THE HOSPITAL COURSE:  Dr. Ermalinda Memos, palliative care, Dr. Mortimer Fries, critical care, Dr. Rockey Situ, cardiology, Dr. Melrose Nakayama, neurology.   FINAL DIAGNOSES:   1.  Acute anoxic encephalopathy.  2.  Arrhythmia, atrial fibrillation and a wide complex tachycardia.  3.  Elevated troponin.  4.  Status epilepticus.  5.  Elevated transaminases.  6.  Acute respiratory failure.  7. Hyperglycemic/hyperosmolar coma.  8.  Systemic inflammatory response syndrome.  9.  Hypertension, uncontrolled.   MEDICATIONS ON DISCHARGE TO THE HOSPICE HOME:  Roxanol 20 mg/mL, 0.25 to 0.5 mL p.o. sublingual every 1 to 2 hours, lorazepam 0.5 to 1 mg p.o. sublingual every 2 to 4 hours, ranitidine 150 mg p.o. b.i.d., ABHR suppository every 4 to 6 hours p.r.n., scopolamine patch 1 patch every 3 days behind the ear.   Please look at Dr. Wyatt Portela interim discharge summary on November 19, 2012. This is a hospital course from November 20, 2012 through November 22, 2012. The patient was extubated in the evening of November 19, 2012, and made comfort care by palliative care. The patient survived through the weekend and actually on Sunday was able to say her name, but on Monday, the day of discharge to hospice home, only grimaced to sternal rub, did not follow any commands and did not communicate. A long discussion with the family over the past 3 days. The patient would not want to be in a nursing home and live like this. They declined putting in a feeding tube. The patient will be discharged to the hospice home. Diet is as tolerated. I do not think she will keep up with her nutritional needs. In my opinion, she would  not pass the swallow evaluation secondary to altered mental status and the anoxic encephalopathy, but can eat for pleasure if needed. They do not expect the patient to survive much longer. The patient is comfort care. Medications for diabetes and seizure were stopped. The patient was in normal sinus rhythm. The patient was breathing on room air upon discharge to the hospice home. Pulse oximetry last done on the 27th was 99%. The patient is kept comfortable.   UNDERLYING ETIOLOGY:  The patient was down on the ground for a few hours, found blue, and seizing. Whether this was the diabetes, uncontrolled, or viral infection that caused her to have an arrhythmia and when she became hypoxic she seized, she has an anoxic encephalopathy and has not recovered. Overall prognosis is poor.   TIME SPENT ON PATIENT CARE TODAY:  35 minutes.   ____________________________ Tana Conch. Leslye Peer, MD [id number] D: 11/22/2012 15:25:00 ET T: 11/22/2012 18:19:53 ET JOB#: [id number]  cc: Tana Conch. Leslye Peer, MD, <Dictator> Marisue Brooklyn MD ELECTRONICALLY SIGNED 11/25/2012 19:07

## 2015-03-16 NOTE — H&P (Signed)
PATIENT NAME:  Yvette Chavez, Yvette Chavez MR#:  [id number] DATE OF BIRTH:  09-02-1951  DATE OF ADMISSION:  11/10/2012  PRIMARY CARE PHYSICIAN: Dr. Eliezer Lofts.   REASON FOR ADMISSION: Altered mental status, found down, status epilepticus.   HISTORY OF PRESENT ILLNESS: The patient is a 64 year old female who has a history of diabetes, hypertension, previous colon cancer, previous infection of MRSA on her back after instrumentation, GERD, allergies, insomnia, peripheral neuropathy, dyslipidemia who, as per her daughter, for the past week has been feeling not so well, not leaving the bed only to go to the bathroom, having body aches, fever-like symptoms, cold, neck pain, back pain, muscle pain, joint pain and diarrhea. She has been feeling dizzy and weak, and for the past 24 hours, she has been very confused with memory loss and having increase in her urinary frequency. Today, she went to work and her husband went to take her daughter to the bus station. When he came back, he went to check on his mother-in-law and found her on the floor lying down on her face with a scratch on the left face, unresponsive. She did have a pulse. Her lips were purple and for what he called the EMS. When EMS picked her up, she was having significant altered mental status. She had seizing activity for which she was transferred to the ER. She was intubated, given several medications for her seizures, including lorazepam, midazolam, phenobarbital and Keppra, until she stopped seizing. She finally stopped seizing. CT scan of the head did not show any bleeding or strokes. Because of her history of febrile illness, the patient had an LP, and she was given medications to cover for meningitis. Her LP came back with no signs of significant infection, and the patient was transferred to the ICU. Her blood sugars were above 600, for what she was initiated on a drip of insulin.   Right after medications for seizure were given, the patient stopped seizing.  The patient developed ventricular tachycardia that resolved after 300 mg bolus of amiodarone. After that, she converted into atrial fibrillation with a normal ventricular response.   REVIEW OF SYSTEMS: Unable to obtain due to altered mental status, intubation and sedation.   PAST MEDICAL HISTORY:  1. Diabetes, insulin dependent.  2. Hypertension.  3. History of colon cancer.  4. History of MRSA infection on the back.  5. Neuropathy.  6. Depression and anxiety.  7. Dyslipidemia.  8. Seasonal allergies.  9. Peripheral edema.   ALLERGIES: THE PATIENT IS ALLERGIC TO SULFA.   PAST SURGICAL HISTORY: The patient had a colectomy in 2002 and back surgery as well. She has multiple complications of her back surgery with an abscess and MRSA infection for what she had several I and D and removal of some of her hardware.   FAMILY HISTORY: Coronary artery disease in her father.   SOCIAL HISTORY: The patient lives with her daughter. She does not smoke. She does not drink. She is disabled due to her back pain. She is divorced.   CURRENT MEDICATIONS: Include Victoza 1.8 mg once a day, tramadol 50 mg 2 times a day p.r.n. pain, Toviaz 4 mg twice daily, torsemide 20 mg once daily, sertraline 100 mg 2 tablets once daily, rabeprazole 20 mg once a day, potassium chloride 10 mg once a day, pantoprazole 40 mg once a day, Nasonex 2 sprays once a day, Singular 10 mg once a day, Lunesta 3 mg at bedtime, Lantus 80 units 2 times a day, gabapentin 800  mg 3 times daily, diphenhydramine 25 mg once a day, Crestor 20 mg once a day, Bystolic 10 mg once a day, Benicar HCT 40/25 mg once a day, aspirin 81 mg once a day.   PHYSICAL EXAMINATION:  VITAL SIGNS: Blood pressure initially 200/94. Oxygen saturation around 100% on vent. Temperature 98. Pulse of 114, now down to 74. Respirations on the vent about 20.  MECHANICAL VENTILATION SETTINGS: Assist control 16, tidal volume of 500, FiO2 of 50%, PEEP of 5.  GENERAL: The patient is  sedated. She is unresponsive. She is critically ill.  HEENT: Her pupils are equal, about 5 mm, sluggish response to light and accommodation. The patient is intubated with an ET tube at about 22 cm. No oral lesions. Dry mucosa.  NECK: Supple. No JVD. No thyromegaly. No adenopathy. No carotid bruits. She has a collar, and she cannot be cleared out by the ER physician at this moment due to her sedation. Her CT of the neck negative. She has a bruise and a scratch on her leftforehead and eyebrow. CARDIOVASCULAR: Irregularly irregular rate and rhythm. No rubs or gallops. Systolic ejection murmur 1/6.  LUNGS: A little bit coarse at this moment. She has a little bit of upper respiratory secretion sounds with rhonchi but no rales. No other abnormal respiratory sounds. No dullness to percussion. No use of accessory muscles. There is no tenderness to palpation of the chest that can be elucidated at this moment due to sedation, and there is no displacement of PMI.  ABDOMEN: Soft, nontender, nondistended. No hepatosplenomegaly. No masses. Bowel sounds are positive. GENITALIA: Negative for external lesions.  EXTREMITIES: No edema, no cyanosis, no clubbing. Pulses +2. Capillary refill less than 3. NEUROLOGIC: The patient is sedated, unable to assess at this moment. The patient is no longer seizing.  PSYCHIATRIC: The patient is unconscious. Unable to assess. SKIN: Without any significant rashes or petechiae.  MUSCULOSKELETAL: No significant joint abnormalities or joint effusions.   RESULTS: Her blood sugars are around 648. Her creatinine is 1.34. Her sodium is 134. Her potassium is 4.2. Her phosphorus 7.2. Her magnesium is 1.8. Lipase 370. Total protein 9.2. Alkaline phosphatase 224. AST 88. Troponins are negative at 0.04. Urine drug screen positive for benzodiazepines. White blood cells are 21,000, hemoglobin 12.1, hematocrit 39, platelet count 402. INR 1. LP clear color, red blood cells 2 in tube #1 and 19 in #2. White  blood cells are zero, neutrophils 0, lymphocytes 0, glucose 296, protein 79. Cultures taken. Urinalysis 1 red blood cell, no white blood cells. Aspirin or salicylate level and Tylenol level are negative. PH on assist control  was 7.25, pCO2 57, pO2 158, HCO3 25. Lactic acid was severely elevated at 3.8.   EKG: Now in atrial fibrillation, left bundle branch block, previous V-tach.   CT of the head did not show any acute abnormalities. Chronic and current changes with diffuse cortical cerebral atrophy with attenuation of subcortical deep periventricular white matter but no acute strokes. No bleeding. CT of the cervical spine: No abnormalities. Chest x-ray: Pulmonary hypoinflation.   ASSESSMENT AND PLAN: A 64 year old female found unconscious with a history of multiple medical problems, including diabetes, hypertension, history of colon cancer, history of methicillin-resistant Staphylococcus aureus infection in the past, neuropathy, depression, anxiety, who has status epilepticus.  1. Altered mental status: At this moment it is unclear the reason. Theories were central nervous system infection but her lumbar puncture is negative. It could still be a viral encephalitis. The patient had a  viral syndrome that was going on for the past 5 days, although at this moment it is not clear that she could have that. She has no lymphocytes. She has only mild elevated protein which could be due to her severe distress and seizures. We are going to get viral cultures from the cerebrospinal fluid including herpes, although she does not have any red blood cells that look crenated. The patient is intubated and sedated, going to the critical care unit.  2. Status epilepticus: Also unclear the reason why she had the status epilepticus. The patient had a fall which probably happened due to her seizure. There are no signs of intracerebral bleeding. There are no signs of new strokes. The patient had very sustained amount of seizures  for a long period of time that required different medications to control. At this moment, I am going to keep her on Keppra IV and add on p.r.n. Ativan for seizures. Again, it is possible that this is related to her severe hyperglycemia. Since she was not having any significant use of her medications due to her viral illness, she could have gone into hyperosmolar event and have seizures from it. The other alternative is that it could be due to a viral encephalitis.  3. Respiratory failure: The patient is intubated for airway protection. At this moment, her vent needs to be checked for settings. Since her pO2 is so high, we are going to decrease FiO2 and try to increase respiratory rate to correct the acidosis.  4. Respiratory acidosis due to altered mental status and encephalopathy, likely metabolic encephalopathy. PCO2 of 57.  5. Hyperglycemia with osmolar coma: The patient on insulin drip.  6. Increased white blood cells, possibly related to infection versus stress. The patient has been pancultured and started on broad-spectrum antibiotics, Zosyn and vancomycin. These antibiotics were started as meningeal doses, but at this moment I feel comfortable reducing it to a regular dose. Leave her on vancomycin since the patient has a history of methicillin-resistant Staphylococcus aureus and we are going to do Rocephin to cover any respiratory flora or urinary tract infection.  7. She has symptoms of urinary tract infection for the past 5 days, but they could be also related to her severe hyperglycemia. Urinalysis is being cultured. Her lungs are actually very clear. No signs of pneumonia on her chest x-ray. She does not have chronic obstructive pulmonary disease.  8. Hypertension: Continue medications for high blood pressure while she is awake. For now, we are going to add on only p.r.n. labetalol.  9. Elevated LFTs, likely due to her stress. Apparently, the patient does not drink. We are going to follow up in the  morning. No history of hypotension to think about shock liver.  10. Acute kidney injury with a creatinine of 1.34. The patient given intravenous fluids. Likely, she has been dehydrated from her possible viral illness.  11. Hyponatremia: Likely, this is due to dehydration. Her blood sugars are in the 600s which will mean that her sodium is much more higher than this.  12. Other medical problems seem to be stable. The patient is a FULL CODE.   TIME SPENT: I spent about 85 minutes with this patient today. Case discussed with the ER physician.   ____________________________ La Rosita Sink, MD [id number] D: 11/10/2012 23:06:27 ET T: 11/11/2012 04:26:14 ET JOB#: [id number]  cc: [place]stream Sink, MD, <Dictator> Aldwin Micalizzi America Brown MD ELECTRONICALLY SIGNED 11/25/2012 7:46
